# Patient Record
Sex: Male | Born: 1955
Health system: Southern US, Community
[De-identification: ages and names within clinical notes are randomized; demographics above are authoritative.]

## PROBLEM LIST (undated history)

## (undated) DIAGNOSIS — R7302 Impaired glucose tolerance (oral): Secondary | ICD-10-CM

## (undated) DIAGNOSIS — J45909 Unspecified asthma, uncomplicated: Secondary | ICD-10-CM

## (undated) DIAGNOSIS — I219 Acute myocardial infarction, unspecified: Secondary | ICD-10-CM

## (undated) DIAGNOSIS — I10 Essential (primary) hypertension: Secondary | ICD-10-CM

## (undated) HISTORY — DX: Unspecified asthma, uncomplicated: J45.909

## (undated) HISTORY — DX: Impaired glucose tolerance (oral): R73.02

---

## 1999-04-17 ENCOUNTER — Emergency Department (HOSPITAL_COMMUNITY): Admission: EM | Admit: 1999-04-17 | Discharge: 1999-04-17 | Payer: Self-pay | Admitting: Emergency Medicine

## 1999-04-18 ENCOUNTER — Encounter: Payer: Self-pay | Admitting: Emergency Medicine

## 1999-09-03 ENCOUNTER — Ambulatory Visit: Admission: RE | Admit: 1999-09-03 | Discharge: 1999-09-03 | Payer: Self-pay | Admitting: Endocrinology

## 1999-09-05 ENCOUNTER — Encounter: Payer: Self-pay | Admitting: Endocrinology

## 1999-09-05 ENCOUNTER — Ambulatory Visit (HOSPITAL_COMMUNITY): Admission: RE | Admit: 1999-09-05 | Discharge: 1999-09-05 | Payer: Self-pay | Admitting: Endocrinology

## 1999-12-03 ENCOUNTER — Ambulatory Visit (HOSPITAL_COMMUNITY): Admission: RE | Admit: 1999-12-03 | Discharge: 1999-12-03 | Payer: Self-pay

## 2000-04-28 ENCOUNTER — Ambulatory Visit: Admission: RE | Admit: 2000-04-28 | Discharge: 2000-04-28 | Payer: Self-pay | Admitting: Internal Medicine

## 2004-08-02 ENCOUNTER — Ambulatory Visit: Payer: Self-pay | Admitting: Internal Medicine

## 2004-12-28 ENCOUNTER — Ambulatory Visit: Payer: Self-pay | Admitting: Internal Medicine

## 2004-12-30 ENCOUNTER — Ambulatory Visit (HOSPITAL_COMMUNITY): Admission: RE | Admit: 2004-12-30 | Discharge: 2004-12-30 | Payer: Self-pay | Admitting: Internal Medicine

## 2005-03-09 ENCOUNTER — Ambulatory Visit: Payer: Self-pay | Admitting: Internal Medicine

## 2006-03-09 ENCOUNTER — Ambulatory Visit: Payer: Self-pay | Admitting: Internal Medicine

## 2006-08-11 ENCOUNTER — Ambulatory Visit: Payer: Self-pay | Admitting: Internal Medicine

## 2007-03-07 ENCOUNTER — Ambulatory Visit: Payer: Self-pay | Admitting: Internal Medicine

## 2007-03-07 LAB — CONVERTED CEMR LAB
ALT: 31 units/L (ref 0–53)
AST: 29 units/L (ref 0–37)
Albumin: 3.9 g/dL (ref 3.5–5.2)
Alkaline Phosphatase: 45 units/L (ref 39–117)
BUN: 10 mg/dL (ref 6–23)
Basophils Absolute: 0 10*3/uL (ref 0.0–0.1)
Basophils Relative: 0.5 % (ref 0.0–1.0)
Bilirubin Urine: NEGATIVE
Bilirubin, Direct: 0.1 mg/dL (ref 0.0–0.3)
CO2: 30 meq/L (ref 19–32)
Calcium: 9.5 mg/dL (ref 8.4–10.5)
Chloride: 109 meq/L (ref 96–112)
Cholesterol: 227 mg/dL (ref 0–200)
Creatinine, Ser: 0.9 mg/dL (ref 0.4–1.5)
Direct LDL: 152.4 mg/dL
Eosinophils Absolute: 0.2 10*3/uL (ref 0.0–0.6)
Eosinophils Relative: 3.1 % (ref 0.0–5.0)
GFR calc Af Amer: 114 mL/min
GFR calc non Af Amer: 94 mL/min
Glucose, Bld: 114 mg/dL — ABNORMAL HIGH (ref 70–99)
HCT: 47.8 % (ref 39.0–52.0)
HDL: 73.5 mg/dL (ref >39.0)
Hemoglobin: 16.4 g/dL (ref 13.0–17.0)
Ketones, ur: NEGATIVE mg/dL
Leukocytes, UA: NEGATIVE
Lymphocytes Relative: 30.4 % (ref 12.0–46.0)
MCHC: 34.4 g/dL (ref 30.0–36.0)
MCV: 93.7 fL (ref 78.0–100.0)
Monocytes Absolute: 0.8 10*3/uL — ABNORMAL HIGH (ref 0.2–0.7)
Monocytes Relative: 10.4 % (ref 3.0–11.0)
Neutro Abs: 4.3 10*3/uL (ref 1.4–7.7)
Neutrophils Relative %: 55.6 % (ref 43.0–77.0)
Nitrite: NEGATIVE
PSA: 0.5 ng/mL (ref 0.10–4.00)
Platelets: 258 10*3/uL (ref 150–400)
Potassium: 4.7 meq/L (ref 3.5–5.1)
RBC: 5.09 M/uL (ref 4.22–5.81)
RDW: 12.5 % (ref 11.5–14.6)
Sodium: 142 meq/L (ref 135–145)
Specific Gravity, Urine: 1.02 (ref 1.000–1.03)
TSH: 1.21 microintl units/mL (ref 0.35–5.50)
Total Bilirubin: 1 mg/dL (ref 0.3–1.2)
Total CHOL/HDL Ratio: 3.1
Total Protein: 7.1 g/dL (ref 6.0–8.3)
Triglycerides: 71 mg/dL (ref 0–149)
Urine Glucose: NEGATIVE mg/dL
Urobilinogen, UA: 0.2 (ref 0.0–1.0)
VLDL: 14 mg/dL (ref 0–40)
WBC: 7.6 10*3/uL (ref 4.5–10.5)
pH: 6 (ref 5.0–8.0)

## 2008-03-04 ENCOUNTER — Ambulatory Visit: Payer: Self-pay | Admitting: Internal Medicine

## 2008-03-04 DIAGNOSIS — I10 Essential (primary) hypertension: Secondary | ICD-10-CM | POA: Insufficient documentation

## 2008-03-06 DIAGNOSIS — K5730 Diverticulosis of large intestine without perforation or abscess without bleeding: Secondary | ICD-10-CM | POA: Insufficient documentation

## 2008-03-06 DIAGNOSIS — J449 Chronic obstructive pulmonary disease, unspecified: Secondary | ICD-10-CM | POA: Insufficient documentation

## 2008-03-06 DIAGNOSIS — J309 Allergic rhinitis, unspecified: Secondary | ICD-10-CM | POA: Insufficient documentation

## 2008-03-06 DIAGNOSIS — F411 Generalized anxiety disorder: Secondary | ICD-10-CM | POA: Insufficient documentation

## 2008-03-06 DIAGNOSIS — Z860100 Personal history of colon polyps, unspecified: Secondary | ICD-10-CM | POA: Insufficient documentation

## 2008-03-06 DIAGNOSIS — E785 Hyperlipidemia, unspecified: Secondary | ICD-10-CM | POA: Insufficient documentation

## 2008-03-27 ENCOUNTER — Ambulatory Visit: Payer: Self-pay | Admitting: Internal Medicine

## 2008-03-28 ENCOUNTER — Telehealth (INDEPENDENT_AMBULATORY_CARE_PROVIDER_SITE_OTHER): Payer: Self-pay | Admitting: *Deleted

## 2008-04-03 ENCOUNTER — Encounter: Payer: Self-pay | Admitting: Internal Medicine

## 2009-06-02 ENCOUNTER — Ambulatory Visit: Payer: Self-pay | Admitting: Internal Medicine

## 2009-06-02 DIAGNOSIS — R0602 Shortness of breath: Secondary | ICD-10-CM | POA: Insufficient documentation

## 2009-06-02 LAB — CONVERTED CEMR LAB
ALT: 27 units/L (ref 0–53)
AST: 28 units/L (ref 0–37)
Albumin: 4.2 g/dL (ref 3.5–5.2)
Alkaline Phosphatase: 44 units/L (ref 39–117)
BUN: 10 mg/dL (ref 6–23)
Basophils Absolute: 0.1 10*3/uL (ref 0.0–0.1)
Basophils Relative: 1.1 % (ref 0.0–3.0)
Bilirubin Urine: NEGATIVE
Bilirubin, Direct: 0.1 mg/dL (ref 0.0–0.3)
CO2: 29 meq/L (ref 19–32)
Calcium: 9.7 mg/dL (ref 8.4–10.5)
Chloride: 107 meq/L (ref 96–112)
Cholesterol: 250 mg/dL — ABNORMAL HIGH (ref 0–200)
Creatinine, Ser: 0.9 mg/dL (ref 0.4–1.5)
Direct LDL: 169.5 mg/dL
Eosinophils Absolute: 0.2 10*3/uL (ref 0.0–0.7)
Eosinophils Relative: 2.4 % (ref 0.0–5.0)
GFR calc non Af Amer: 93.36 mL/min (ref >60)
Glucose, Bld: 111 mg/dL — ABNORMAL HIGH (ref 70–99)
HCT: 47.5 % (ref 39.0–52.0)
HDL: 70.3 mg/dL (ref >39.00)
Hemoglobin: 16.5 g/dL (ref 13.0–17.0)
Ketones, ur: NEGATIVE mg/dL
Leukocytes, UA: NEGATIVE
Lymphocytes Relative: 27.9 % (ref 12.0–46.0)
Lymphs Abs: 2 10*3/uL (ref 0.7–4.0)
MCHC: 34.8 g/dL (ref 30.0–36.0)
MCV: 96.3 fL (ref 78.0–100.0)
Monocytes Absolute: 1 10*3/uL (ref 0.1–1.0)
Monocytes Relative: 13.8 % — ABNORMAL HIGH (ref 3.0–12.0)
Neutro Abs: 4 10*3/uL (ref 1.4–7.7)
Neutrophils Relative %: 54.8 % (ref 43.0–77.0)
Nitrite: NEGATIVE
PSA: 0.7 ng/mL (ref 0.10–4.00)
Platelets: 230 10*3/uL (ref 150.0–400.0)
Potassium: 4.9 meq/L (ref 3.5–5.1)
RBC: 4.93 M/uL (ref 4.22–5.81)
RDW: 12.2 % (ref 11.5–14.6)
Sodium: 142 meq/L (ref 135–145)
Specific Gravity, Urine: 1.015 (ref 1.000–1.030)
TSH: 0.96 microintl units/mL (ref 0.35–5.50)
Total Bilirubin: 1.2 mg/dL (ref 0.3–1.2)
Total CHOL/HDL Ratio: 4
Total Protein, Urine: NEGATIVE mg/dL
Total Protein: 7.6 g/dL (ref 6.0–8.3)
Triglycerides: 109 mg/dL (ref 0.0–149.0)
Urine Glucose: NEGATIVE mg/dL
Urobilinogen, UA: 0.2 (ref 0.0–1.0)
VLDL: 21.8 mg/dL (ref 0.0–40.0)
WBC: 7.3 10*3/uL (ref 4.5–10.5)
pH: 5.5 (ref 5.0–8.0)

## 2009-06-11 ENCOUNTER — Ambulatory Visit: Payer: Self-pay | Admitting: Cardiology

## 2009-06-11 ENCOUNTER — Ambulatory Visit: Payer: Self-pay

## 2009-06-11 ENCOUNTER — Encounter: Payer: Self-pay | Admitting: Internal Medicine

## 2009-06-11 ENCOUNTER — Ambulatory Visit (HOSPITAL_COMMUNITY): Admission: RE | Admit: 2009-06-11 | Discharge: 2009-06-11 | Payer: Self-pay | Admitting: Cardiology

## 2010-07-08 ENCOUNTER — Ambulatory Visit: Payer: Self-pay | Admitting: Internal Medicine

## 2010-07-08 DIAGNOSIS — I519 Heart disease, unspecified: Secondary | ICD-10-CM | POA: Insufficient documentation

## 2010-09-10 ENCOUNTER — Ambulatory Visit
Admission: RE | Admit: 2010-09-10 | Discharge: 2010-09-10 | Payer: Self-pay | Source: Home / Self Care | Attending: Internal Medicine | Admitting: Internal Medicine

## 2010-09-13 ENCOUNTER — Encounter: Payer: Self-pay | Admitting: Internal Medicine

## 2010-10-05 NOTE — Assessment & Plan Note (Signed)
Summary: follow up to refill rx-lb   Vital Signs:  Patient profile:   55 year old male Height:      73.5 inches Weight:      224.38 pounds BMI:     29.31 O2 Sat:      96 % on Room air Temp:     97.6 degrees F oral Pulse rate:   75 / minute BP sitting:   152 / 100  (left arm) Cuff size:   large  Vitals Entered By: Zella Ball Ewing CMA (AAMA) (July 08, 2010 9:05 AM)  O2 Flow:  Room air  Preventive Care Screening  Last Tetanus Booster:    Date:  09/05/2001    Results:  Tdap      decliens flu shot todoay or pneumonia shot  CC: followup refill/RE   CC:  followup refill/RE.  History of Present Illness: here for wellness, now down to   1 pack per wk, unfort gained 9 lbs since last yr;  BP at home < 140/90 ;  not owrked for 8 mo;  fortunatlye doing ok financially;  incidently with 3 wks AM prod ocugh after recent bronchitis he caught from the family;  Pt denies CP, worsening sob, doe, wheezing, orthopnea, pnd, worsening LE edema, palps, dizziness or syncope  Pt denies new neuro symptoms such as headache, facial or extremity weakness   Pt denies polydipsia, polyuria..  Overall good compliance with meds, trying to follow low chol diet, wt stable, little excercise however No fever, wt loss, night sweats, loss of appetite or other constitutional symptoms  Denies worsening depressive symptoms, suicidal ideation, or panic.   Pt states good ability with ADL's, low fall risk, home safety reviewed and adequate, no significant change in hearing or vision, trying to follow lower chol diet, and occasionally active only with regular excercise.   Preventive Screening-Counseling & Management      Drug Use:  no.    Problems Prior to Update: 1)  Diastolic Dysfunction  (ICD-429.9) 2)  Dyspnea  (ICD-786.05) 3)  Preventive Health Care  (ICD-V70.0) 4)  Diverticulosis, Colon  (ICD-562.10) 5)  Colonic Polyps, Hx of  (ICD-V12.72) 6)  COPD  (ICD-496) 7)  Anxiety  (ICD-300.00) 8)  Allergic Rhinitis   (ICD-477.9) 9)  Hyperlipidemia  (ICD-272.4) 10)  Hypertension  (ICD-401.9)  Medications Prior to Update: 1)  Epipen 2-Pak 0.3 Mg/0.45ml (1:1000)  Devi (Epinephrine Hcl (Anaphylaxis)) .... Use Asd 2)  Proair Hfa 108 (90 Base) Mcg/act Aers (Albuterol Sulfate) .... 2 Puffs Qid As Needed 3)  Norvasc 5 Mg Tabs (Amlodipine Besylate) .... Take 1 By Mouth Once Daily  Current Medications (verified): 1)  Epipen 2-Pak 0.3 Mg/0.87ml (1:1000)  Devi (Epinephrine Hcl (Anaphylaxis)) .... Use Asd 2)  Proair Hfa 108 (90 Base) Mcg/act Aers (Albuterol Sulfate) .... 2 Puffs Qid As Needed 3)  Norvasc 5 Mg Tabs (Amlodipine Besylate) .... Take 1 By Mouth Once Daily 4)  Albuterol Aerosol .... 2 Puffs Four Times Per Day As Needed  Allergies (verified): 1)  ! Doxycycline 2)  ! Ace Inhibitors 3)  ! Beta Blockers 4)  ! * Bee Stings  Past History:  Past Surgical History: Last updated: 03/04/2008 Denies surgical history  Family History: Last updated: 03/04/2008 DM heart disease thyroid disease  Social History: Last updated: 07/08/2010 Alcohol use-yes Current Smoker work - Holiday representative Drug use-no Divorced  Risk Factors: Smoking Status: current (03/04/2008)  Past Medical History: Hypertension Hyperlipidemia alcohol abuse- ongoing Allergic rhinitis Anxiety COPD hypertensive cardiomyopathy - EF 50%,  mild AI by echo 8/01 Colonic polyps, hx of Diverticulosis, colon diastolic dysfunction  Social History: Alcohol use-yes 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  The patient denies anorexia, fever, weight loss, vision loss, decreased hearing, hoarseness, chest pain, syncope, dyspnea on exertion, peripheral edema, prolonged cough, headaches, hemoptysis, abdominal pain, melena, hematochezia, severe indigestion/heartburn, hematuria, muscle weakness, suspicious skin lesions, transient blindness,  depression, unusual weight change, abnormal bleeding, enlarged lymph nodes, and angioedema.         all otherwise negative per pt -    Physical Exam  General:  alert and overweight-appearing.   Head:  normocephalic and atraumatic.   Eyes:  vision grossly intact, pupils equal, and pupils round.   Ears:  R ear normal and L ear normal.   Nose:  no external deformity and no nasal discharge.   Mouth:  no gingival abnormalities and pharynx pink and moist.   Neck:  supple and no masses.   Lungs:  normal respiratory effort and normal breath sounds.   Heart:  normal rate and regular rhythm.   Abdomen:  soft, non-tender, and normal bowel sounds.   Msk:  normal ROM, no joint tenderness, and no joint swelling.   Extremities:  no edema, no erythema  Neurologic:  cranial nerves II-XII intact and strength normal in all extremities.   Skin:  color normal and no rashes.   Psych:  normally interactive, not depressed appearing, and moderately anxious.     Impression & Recommendations:  Problem # 1:  Preventive Health Care (ICD-V70.0) Overall doing well, age appropriate education and counseling updated, referral for preventive services and immunizations addressed, dietary counseling and smoking status adressed , most recent labs reviewed I have personally reviewed and have noted 1.The patient's medical and social history 2.Their use of alcohol, tobacco or illicit drugs 3.Their current medications and supplements 4. Functional ability including ADL's, fall risk, home safety risk, hearing & visual impairment  5.Diet and physical activities 6.Evidence for depression or mood disorders The patients weight, height, BMI  have been recorded in the chart I have made referrals, counseling and provided education to the patient based review of the above   Problem # 2:  COPD (ICD-496)  decliens spiriva ; to check PFT's His updated medication list for this problem includes:    Proair Hfa 108 (90 Base) Mcg/act  Aers (Albuterol sulfate) .Marland Kitchen... 2 puffs qid as needed  Orders: Misc. Referral (Misc. Ref)  Problem # 3:  HYPERLIPIDEMIA (ICD-272.4)  declines statin, Pt to continue diet efforts, good med tolerance; to check labs - goal LDL less than 100  Labs Reviewed: SGOT: 28 (06/02/2009)   SGPT: 27 (06/02/2009)   HDL:70.30 (06/02/2009), 73.5 (03/07/2007)  LDL:DEL (03/07/2007)  Chol:250 (06/02/2009), 227 (03/07/2007)  Trig:109.0 (06/02/2009), 71 (03/07/2007)  Problem # 4:  HYPERTENSION (ICD-401.9)  His updated medication list for this problem includes:    Norvasc 5 Mg Tabs (Amlodipine besylate) .Marland Kitchen... Take 1 by mouth once daily stable overall by hx and exam, ok to continue meds/tx as is   BP today: 152/100 Prior BP: 154/90 (06/02/2009)  Labs Reviewed: K+: 4.9 (06/02/2009) Creat: : 0.9 (06/02/2009)   Chol: 250 (06/02/2009)   HDL: 70.30 (06/02/2009)   LDL: DEL (03/07/2007)   TG: 109.0 (06/02/2009)  Complete Medication List: 1)  Epipen 2-pak 0.3 Mg/0.26ml (1:1000) Devi (Epinephrine hcl (anaphylaxis)) .... Use asd 2)  Proair Hfa  108 (90 Base) Mcg/act Aers (Albuterol sulfate) .... 2 puffs qid as needed 3)  Norvasc 5 Mg Tabs (Amlodipine besylate) .... Take 1 by mouth once daily 4)  Albuterol Aerosol  .... 2 puffs four times per day as needed  Patient Instructions: 1)  Continue all previous medications as before this visit 2)  Please call if you would like your blood work done later 3)  You will be contacted about the referral(s) to: Lung testing after the first of the year 4)  Please schedule a follow-up appointment in 1 year, or sooner if needed Prescriptions: EPIPEN 2-PAK 0.3 MG/0.3ML (1:1000)  DEVI (EPINEPHRINE HCL (ANAPHYLAXIS)) use asd  #1 x 0   Entered and Authorized by:   Corwin Levins MD   Signed by:   Corwin Levins MD on 07/08/2010   Method used:   Print then Give to Patient   RxID:   [id number] NORVASC 5 MG TABS (AMLODIPINE BESYLATE) take 1 by mouth once daily Brand medically  necessary #30 x 11   Entered and Authorized by:   Corwin Levins MD   Signed by:   Corwin Levins MD on 07/08/2010   Method used:   Print then Give to Patient   RxID:   [id number] ALBUTEROL AEROSOL 2 puffs four times per day as needed  #1 x 11   Entered and Authorized by:   Corwin Levins MD   Signed by:   Corwin Levins MD on 07/08/2010   Method used:   Print then Give to Patient   RxID:   [id number]    Orders Added: 1)  Misc. Referral [Misc. Ref] 2)  Est. Patient 40-64 years [phone]

## 2010-10-07 NOTE — Miscellaneous (Signed)
Summary: Orders Update pft charges  Clinical Lists Changes  Orders: Added new Service order of Carbon Monoxide diffusing w/capacity (94720) - Signed Added new Service order of Lung Volumes (94240) - Signed Added new Service order of Spirometry (Pre & Post) (94060) - Signed 

## 2011-07-08 ENCOUNTER — Other Ambulatory Visit: Payer: Self-pay | Admitting: Internal Medicine

## 2011-09-28 ENCOUNTER — Other Ambulatory Visit: Payer: Self-pay | Admitting: Internal Medicine

## 2011-11-14 ENCOUNTER — Other Ambulatory Visit: Payer: Self-pay | Admitting: Internal Medicine

## 2012-02-04 ENCOUNTER — Emergency Department (HOSPITAL_COMMUNITY)
Admission: EM | Admit: 2012-02-04 | Discharge: 2012-02-05 | Discharge status: Against Medical Advice | Payer: BC Managed Care – PPO | Attending: Emergency Medicine | Admitting: Emergency Medicine

## 2012-02-04 ENCOUNTER — Encounter (HOSPITAL_COMMUNITY): Payer: Self-pay | Admitting: Emergency Medicine

## 2012-02-04 DIAGNOSIS — I10 Essential (primary) hypertension: Secondary | ICD-10-CM

## 2012-02-04 DIAGNOSIS — J449 Chronic obstructive pulmonary disease, unspecified: Secondary | ICD-10-CM | POA: Insufficient documentation

## 2012-02-04 DIAGNOSIS — R079 Chest pain, unspecified: Secondary | ICD-10-CM

## 2012-02-04 DIAGNOSIS — J4489 Other specified chronic obstructive pulmonary disease: Secondary | ICD-10-CM | POA: Insufficient documentation

## 2012-02-04 DIAGNOSIS — F17200 Nicotine dependence, unspecified, uncomplicated: Secondary | ICD-10-CM | POA: Insufficient documentation

## 2012-02-04 HISTORY — DX: Essential (primary) hypertension: I10

## 2012-02-04 HISTORY — DX: Acute myocardial infarction, unspecified: I21.9

## 2012-02-04 NOTE — ED Notes (Signed)
PT. ARRIVED WITH EMS FROM HOME , REPORTS MID CHEST PAIN WHILE EATING DINNER THIS EVENING AND VOMITTED ONCE , DENIES SOB , RECEIVED 4 BABY ASA BY EMS PTA , STATES " MILD HEART ATTACK WHEN I WAS 56 YEARS OLD.

## 2012-02-05 ENCOUNTER — Emergency Department (HOSPITAL_COMMUNITY): Payer: BC Managed Care – PPO

## 2012-02-05 LAB — BASIC METABOLIC PANEL
BUN: 10 mg/dL (ref 6–23)
Calcium: 8.6 mg/dL (ref 8.4–10.5)
GFR calc non Af Amer: 78 mL/min — ABNORMAL LOW (ref >90)
Glucose, Bld: 117 mg/dL — ABNORMAL HIGH (ref 70–99)

## 2012-02-05 LAB — CBC
HCT: 42.2 % (ref 39.0–52.0)
Hemoglobin: 15 g/dL (ref 13.0–17.0)
MCH: 32.4 pg (ref 26.0–34.0)
MCHC: 35.5 g/dL (ref 30.0–36.0)
MCV: 91.1 fL (ref 78.0–100.0)
Platelets: 195 10*3/uL (ref 150–400)
RBC: 4.63 MIL/uL (ref 4.22–5.81)
RDW: 12.6 % (ref 11.5–15.5)
WBC: 8.3 10*3/uL (ref 4.0–10.5)

## 2012-02-05 LAB — DIFFERENTIAL
Eosinophils Absolute: 0.2 10*3/uL (ref 0.0–0.7)
Eosinophils Relative: 3 % (ref 0–5)
Lymphs Abs: 2.4 10*3/uL (ref 0.7–4.0)
Monocytes Relative: 9 % (ref 3–12)

## 2012-02-05 NOTE — ED Provider Notes (Signed)
History     CSN: [id number]  Arrival date & time 02/04/12  2342   First MD Initiated Contact with Patient 02/04/12 2343      Chief Complaint  Patient presents with  . Chest Pain    (Consider location/radiation/quality/duration/timing/severity/associated sxs/prior treatment) HPI Comments: Patient that is a current everyday smoker with a history of heart attack (age 56), hypertension, and COPD presents emergency department with chief complaint of chest pain.  Onset of symptoms began this evening around 10 p.m. while eating dinner.  Location substernal without radiation.  Description is "I feel like an ice pick is stabbing my sternum" patient states that he feels if she could burp or have an emesis episode it would get better.  The pain is constant and still present.  Severity was 7/10 and is now currently 3/10 after taking 324 ASA.  Patient refused nitroglycerin in route because it causes headaches and he states he does not want any narcotics throughout his stay because he has had substance abuse issues in the past before. Associated symptoms include vomiting times one and nausea that did not improve the chest pain.  Patient denies any diaphoresis, shortness of breath, exertional chest pain, abdominal pain, back pain, jaw or left arm pain, PND, orthopnea, claudication, extremity edema, fevers, night sweats, chills, cough.  No other complaints this time.  PCP Dr. Jonny Ruiz Last 2D echo in 2010: Study Conclusions 1. Left ventricle: There is diastolic dysfunction. The estimated ejection fraction was 60%. Wall motion was normal; there were no regional wall motion abnormalities. 2. Right ventricle: Systolic function was normal.   Patient is a 56 y.o. male presenting with chest pain. The history is provided by the patient.  Chest Pain     Past Medical History  Diagnosis Date  . Asthma   . Hypertension   . Heart attack     History reviewed. No pertinent past surgical history.  No family history  on file.  History  Substance Use Topics  . Smoking status: Current Everyday Smoker  . Smokeless tobacco: Not on file  . Alcohol Use: Yes      Review of Systems  Cardiovascular: Positive for chest pain.  All other systems reviewed and are negative.    Allergies  Ace inhibitors; Beta adrenergic blockers; and Doxycycline  Home Medications   Current Outpatient Rx  Name Route Sig Dispense Refill  . AMLODIPINE BESYLATE 5 MG PO TABS Oral Take 5 mg by mouth daily.    . ASPIRIN 325 MG PO TABS Oral Take 325 mg by mouth 2 (two) times a week.      BP 148/94  Pulse 70  Temp(Src) 98.4 F (36.9 C) (Oral)  Resp 16  SpO2 97%  Physical Exam  Nursing note and vitals reviewed. Constitutional: He appears well-developed and well-nourished. No distress.  HENT:  Head: Normocephalic and atraumatic.  Eyes: Conjunctivae and EOM are normal. Pupils are equal, round, and reactive to light.  Neck: Normal range of motion. Neck supple. Normal carotid pulses and no JVD present. Carotid bruit is not present. No rigidity. Normal range of motion present.  Cardiovascular: Normal rate, regular rhythm, S1 normal, S2 normal, normal heart sounds, intact distal pulses and normal pulses.  Exam reveals no gallop and no friction rub.   No murmur heard.      No pitting edema bilaterally, RRR, no aberrant sounds on auscultations, distal pulses intact, no carotid bruit or JVD.   Pulmonary/Chest: Effort normal and breath sounds normal. No accessory muscle usage  or stridor. No respiratory distress. He exhibits no tenderness and no bony tenderness.  Abdominal: Bowel sounds are normal.       Obese non tender. Non pulsatile aorta.   Skin: Skin is warm, dry and intact. No rash noted. He is not diaphoretic. No cyanosis. Nails show no clubbing.    ED Course  Procedures (including critical care time)  Labs Reviewed  BASIC METABOLIC PANEL - Abnormal; Notable for the following:    Glucose, Bld 117 (*)    GFR calc non  Af Amer 78 (*)    GFR calc Af Amer 90 (*)    All other components within normal limits  CBC  DIFFERENTIAL  POCT I-STAT TROPONIN I   Dg Chest 2 View  02/05/2012  *RADIOLOGY REPORT*  Clinical Data: Chest pain.  Smoker.  COPD.  CHEST - 2 VIEW  Comparison: 06/02/2009.  Findings: The heart remains normal in size and the aorta remains tortuous.  Bullous changes are again demonstrated.  There is also a small amount of linear atelectasis or scarring in the right middle lobe without significant change.  Otherwise, clear lungs. Unremarkable bones.  IMPRESSION: Stable changes of COPD.  No acute abnormality.  Original Report Authenticated By: Darrol Angel, M.D.     No diagnosis found.   Date: 02/05/2012  Rate: 75  Rhythm: normal sinus rhythm  QRS Axis: normal  Intervals: normal  ST/T Wave abnormalities: normal  Conduction Disutrbances: none  Narrative Interpretation:   Old EKG Reviewed: No significant changes noted      MDM  Atypical Chest pain   Do to cardiac risk factors I recommended patient be admitted to hospital for further evaluation of chest pain.  Patient refuses and wants to leave AGAINST MEDICAL ADVICE.  Discussed thoroughly reasons to return to the emergency department and explained that he is always welcome to come back.  Patient verbalizes understanding of the risk of leaving and states that he still wants to leave.  Patient is still currently having active chest pain at 3/10 but states he will not sit the hospital and will followup with his doctor.  Patient with vital signs stable, normal chest x-ray, labs within normal limits and non-elevated troponin prior to leaving.  Patient was discussed with Dr. Hyacinth Meeker who agreed with my plan to admit the patient and has been notified on patient's choice to leave the hospital AMA.      Jaci Carrel, [place] 02/05/12 0126

## 2012-02-05 NOTE — ED Provider Notes (Signed)
Medical screening examination/treatment/procedure(s) were performed by non-physician practitioner and as supervising physician I was immediately available for consultation/collaboration.    Vida Roller, MD 02/05/12 [phone]

## 2012-02-05 NOTE — ED Notes (Signed)
PT. SIGNED AMA FORM , DENIES PAIN , NO SOB.

## 2012-11-05 ENCOUNTER — Other Ambulatory Visit: Payer: Self-pay | Admitting: Internal Medicine

## 2012-11-05 NOTE — Telephone Encounter (Signed)
Done erx , 1 mo only  Please contact pt - due for ROV

## 2012-11-05 NOTE — Telephone Encounter (Signed)
Do not see where pt has been seen at this office. Please advise.

## 2012-11-07 ENCOUNTER — Other Ambulatory Visit: Payer: Self-pay | Admitting: Internal Medicine

## 2012-12-05 ENCOUNTER — Other Ambulatory Visit: Payer: Self-pay | Admitting: Internal Medicine

## 2012-12-07 ENCOUNTER — Other Ambulatory Visit: Payer: Self-pay | Admitting: Internal Medicine

## 2012-12-12 ENCOUNTER — Other Ambulatory Visit: Payer: Self-pay | Admitting: Internal Medicine

## 2012-12-12 ENCOUNTER — Encounter: Payer: Self-pay | Admitting: Internal Medicine

## 2012-12-12 ENCOUNTER — Ambulatory Visit (INDEPENDENT_AMBULATORY_CARE_PROVIDER_SITE_OTHER): Payer: BC Managed Care – PPO | Admitting: Internal Medicine

## 2012-12-12 ENCOUNTER — Other Ambulatory Visit (INDEPENDENT_AMBULATORY_CARE_PROVIDER_SITE_OTHER): Payer: BC Managed Care – PPO

## 2012-12-12 VITALS — BP 132/78 | HR 73 | Temp 97.6°F | Ht 73.0 in | Wt 225.0 lb

## 2012-12-12 DIAGNOSIS — J449 Chronic obstructive pulmonary disease, unspecified: Secondary | ICD-10-CM

## 2012-12-12 DIAGNOSIS — Z0001 Encounter for general adult medical examination with abnormal findings: Secondary | ICD-10-CM | POA: Insufficient documentation

## 2012-12-12 DIAGNOSIS — R7309 Other abnormal glucose: Secondary | ICD-10-CM

## 2012-12-12 DIAGNOSIS — R7302 Impaired glucose tolerance (oral): Secondary | ICD-10-CM

## 2012-12-12 DIAGNOSIS — J4489 Other specified chronic obstructive pulmonary disease: Secondary | ICD-10-CM

## 2012-12-12 DIAGNOSIS — I10 Essential (primary) hypertension: Secondary | ICD-10-CM

## 2012-12-12 DIAGNOSIS — Z87891 Personal history of nicotine dependence: Secondary | ICD-10-CM | POA: Insufficient documentation

## 2012-12-12 DIAGNOSIS — F411 Generalized anxiety disorder: Secondary | ICD-10-CM

## 2012-12-12 DIAGNOSIS — Z0000 Encounter for general adult medical examination without abnormal findings: Secondary | ICD-10-CM

## 2012-12-12 DIAGNOSIS — F1010 Alcohol abuse, uncomplicated: Secondary | ICD-10-CM

## 2012-12-12 DIAGNOSIS — F17200 Nicotine dependence, unspecified, uncomplicated: Secondary | ICD-10-CM

## 2012-12-12 HISTORY — DX: Impaired glucose tolerance (oral): R73.02

## 2012-12-12 LAB — CBC WITH DIFFERENTIAL/PLATELET
Basophils Relative: 0.5 % (ref 0.0–3.0)
Eosinophils Relative: 2.1 % (ref 0.0–5.0)
HCT: 46.3 % (ref 39.0–52.0)
Hemoglobin: 16.2 g/dL (ref 13.0–17.0)
Lymphs Abs: 2.3 10*3/uL (ref 0.7–4.0)
MCV: 95.2 fl (ref 78.0–100.0)
Monocytes Absolute: 1 10*3/uL (ref 0.1–1.0)
Neutro Abs: 4.8 10*3/uL (ref 1.4–7.7)
Platelets: 261 10*3/uL (ref 150.0–400.0)
WBC: 8.2 10*3/uL (ref 4.5–10.5)

## 2012-12-12 LAB — URINALYSIS, ROUTINE W REFLEX MICROSCOPIC
Bilirubin Urine: NEGATIVE
Ketones, ur: NEGATIVE
Specific Gravity, Urine: 1.01 (ref 1.000–1.030)
Total Protein, Urine: NEGATIVE
Urine Glucose: NEGATIVE
pH: 6 (ref 5.0–8.0)

## 2012-12-12 LAB — LDL CHOLESTEROL, DIRECT: Direct LDL: 150.6 mg/dL

## 2012-12-12 LAB — HEPATIC FUNCTION PANEL
AST: 28 U/L (ref 0–37)
Total Bilirubin: 1 mg/dL (ref 0.3–1.2)

## 2012-12-12 LAB — LIPID PANEL
HDL: 63.7 mg/dL (ref >39.00)
Triglycerides: 91 mg/dL (ref 0.0–149.0)
VLDL: 18.2 mg/dL (ref 0.0–40.0)

## 2012-12-12 LAB — PSA: PSA: 0.77 ng/mL (ref 0.10–4.00)

## 2012-12-12 LAB — BASIC METABOLIC PANEL
BUN: 10 mg/dL (ref 6–23)
Calcium: 9.2 mg/dL (ref 8.4–10.5)
GFR: 102.62 mL/min (ref >60.00)
Potassium: 4.5 mEq/L (ref 3.5–5.1)
Sodium: 137 mEq/L (ref 135–145)

## 2012-12-12 LAB — HEMOGLOBIN A1C: Hgb A1c MFr Bld: 5.5 % (ref 4.6–6.5)

## 2012-12-12 LAB — TSH: TSH: 1.08 u[IU]/mL (ref 0.35–5.50)

## 2012-12-12 MED ORDER — TIOTROPIUM BROMIDE MONOHYDRATE 18 MCG IN CAPS: 18.0000 ug | ORAL_CAPSULE | Freq: Every day | RESPIRATORY_TRACT | Status: DC

## 2012-12-12 MED ORDER — ALBUTEROL SULFATE HFA 108 (90 BASE) MCG/ACT IN AERS: 2.0000 | INHALATION_SPRAY | Freq: Four times a day (QID) | RESPIRATORY_TRACT | Status: DC | PRN

## 2012-12-12 MED ORDER — EPINEPHRINE 0.3 MG/0.3ML IJ DEVI: 0.3000 mg | Freq: Once | INTRAMUSCULAR | Status: DC

## 2012-12-12 MED ORDER — ATORVASTATIN CALCIUM 10 MG PO TABS: 10.0000 mg | ORAL_TABLET | Freq: Every day | ORAL | Status: DC

## 2012-12-12 MED ORDER — FLUTICASONE-SALMETEROL 250-50 MCG/DOSE IN AEPB: 1.0000 | INHALATION_SPRAY | Freq: Two times a day (BID) | RESPIRATORY_TRACT | Status: DC

## 2012-12-12 MED ORDER — AMLODIPINE BESYLATE 5 MG PO TABS: 5.0000 mg | ORAL_TABLET | Freq: Every day | ORAL | Status: DC

## 2012-12-12 MED ORDER — ASPIRIN 81 MG PO TBEC: 81.0000 mg | DELAYED_RELEASE_TABLET | Freq: Every day | ORAL | Status: AC

## 2012-12-12 NOTE — Assessment & Plan Note (Signed)
Asympt, for a1c 

## 2012-12-12 NOTE — Assessment & Plan Note (Signed)
To add spiriva, and advair trial as well, cont proair, declines pulm referral, cxr or other eval or tx at this time

## 2012-12-12 NOTE — Assessment & Plan Note (Signed)
Urged to abstain 

## 2012-12-12 NOTE — Assessment & Plan Note (Signed)
stable overall by history and exam, recent data reviewed with pt, and pt to continue medical treatment as before,  to f/u any worsening symptoms or concerns BP Readings from Last 3 Encounters:  12/12/12 132/78  02/05/12 148/94  07/08/10 152/100

## 2012-12-12 NOTE — Assessment & Plan Note (Signed)

## 2012-12-12 NOTE — Patient Instructions (Addendum)
Please try the Spiriva at one inhalation per day Please also try the Advair 250/50 at 1 puff twice per day You are also given the Albuterol inhaler to use as needed (and a prescription sent as well) Please call if you feel the spiriva and/or the advair helped so that we can send a prescription Please continue all other medications as before, and refills have been done if requested - the Brand Name Norvasc 5 mg, and the Epipen Please also take the Aspirin 81 mg (enteric coated) every day at 1 per day, as this reduces risk of heart attack and stroke Please stop smoking Please stop drinking Alcohol (or at least cut back) You can consider applying for Disability at the local Medicare office on [address] Please call if you change your mind about the colonoscopy Please go to the LAB in the Basement (turn left off the elevator) for the tests to be done today You will be contacted by phone if any changes need to be made immediately.  Otherwise, you will receive a letter about your results with an explanation Please remember to sign up for My Chart if you have not done so, as this will be important to you in the future with finding out test results, communicating by private email, and scheduling acute appointments online when needed. Please return in 1 year for your yearly visit, or sooner if needed, with Lab testing done 3-5 days before

## 2012-12-12 NOTE — Assessment & Plan Note (Signed)
Counseled to quit, declines chantix 

## 2012-12-12 NOTE — Progress Notes (Signed)
Subjective:    Patient ID: Colin Wilson, male    DOB: Mar 13, 1956, 57 y.o.   MRN: [id number]  HPI  Here for wellness and f/u after lost to f/u since early 2012;  Overall doing ok;  Pt denies CP,  wheezing, orthopnea, PND, worsening LE edema, palpitations, dizziness or syncope, but has had some worsening sob/doe since last seen, using proair more than 2 times per wk, worse seasonally it seems.   Pt denies neurological change such as new headache, facial or extremity weakness.  Pt denies polydipsia, polyuria, or low sugar symptoms. Pt states overall good compliance with treatment and medications, good tolerability, and has been trying to follow lower cholesterol diet.  Pt denies worsening depressive symptoms, suicidal ideation or panic. No fever, night sweats, wt loss, loss of appetite, or other constitutional symptoms.  Pt states good ability with ADL's, has low fall risk, home safety reviewed and adequate, no other significant changes in hearing or vision, and only occasionally active with exercise.  Need med refills.  Has no complaints and quite adamant he only wants his med refill so he can leave asap. Admits to signficant ETOH use but declines to be specific, and declines colonoscopy.  Still smoking, not ready to quit Past Medical History  Diagnosis Date  . Asthma   . Hypertension   . Heart attack    No past surgical history on file.  reports that he has been smoking.  He does not have any smokeless tobacco history on file. He reports that  drinks alcohol. He reports that he does not use illicit drugs. family history is not on file. Allergies  Allergen Reactions  . Ace Inhibitors Other (See Comments)    unknown  . Beta Adrenergic Blockers Other (See Comments)    unknown  . Doxycycline Other (See Comments)    unknown   No current outpatient prescriptions on file prior to visit.   No current facility-administered medications on file prior to visit.   Review of Systems Constitutional:  Negative for diaphoresis, activity change, appetite change or unexpected weight change.  HENT: Negative for hearing loss, ear pain, facial swelling, mouth sores and neck stiffness.   Eyes: Negative for pain, redness and visual disturbance.  Respiratory: Negative for shortness of breath and wheezing.   Cardiovascular: Negative for chest pain and palpitations.  Gastrointestinal: Negative for diarrhea, blood in stool, abdominal distention or other pain Genitourinary: Negative for hematuria, flank pain or change in urine volume.  Musculoskeletal: Negative for myalgias and joint swelling.  Skin: Negative for color change and wound.  Neurological: Negative for syncope and numbness. other than noted Hematological: Negative for adenopathy.  Psychiatric/Behavioral: Negative for hallucinations, self-injury, decreased concentration and agitation.       Objective:   Physical Exam BP 132/78  Pulse 73  Temp(Src) 97.6 F (36.4 C) (Oral)  Ht 6\' 1"  (1.854 m)  Wt 225 lb (102.059 kg)  BMI 29.69 kg/m2  SpO2 96% VS noted, appears to have gained some wt Constitutional: Pt is oriented to person, place, and time. Appears well-developed and well-nourished.  Head: Normocephalic and atraumatic.  Right Ear: External ear normal.  Left Ear: External ear normal.  Nose: Nose normal.  Mouth/Throat: Oropharynx is clear and moist.  Eyes: Conjunctivae and EOM are normal. Pupils are equal, round, and reactive to light.  Neck: Normal range of motion. Neck supple. No JVD present. No tracheal deviation present.  Cardiovascular: Normal rate, regular rhythm, normal heart sounds and intact distal pulses.  Pulmonary/Chest: Effort normal and breath sounds normal.  Abdominal: Soft. Bowel sounds are normal. There is no tenderness. No HSM  Musculoskeletal: Normal range of motion. Exhibits no edema.  Lymphadenopathy:  Has no cervical adenopathy.  Neurological: Pt is alert and oriented to person, place, and time. Pt has  normal reflexes. No cranial nerve deficit. Motor/dtr intact Skin: Skin is warm and dry. No rash noted.  Psychiatric:  Some irritable and nervous today. Behavior is o/w normal.     Assessment & Plan:

## 2012-12-12 NOTE — Assessment & Plan Note (Signed)
Mild, declines further tx 

## 2013-07-26 ENCOUNTER — Encounter (HOSPITAL_COMMUNITY): Payer: Self-pay | Admitting: Emergency Medicine

## 2013-07-26 ENCOUNTER — Telehealth: Payer: Self-pay | Admitting: Internal Medicine

## 2013-07-26 ENCOUNTER — Emergency Department (HOSPITAL_COMMUNITY)
Admission: EM | Admit: 2013-07-26 | Discharge: 2013-07-26 | Disposition: A | Discharge status: Home / Self Care | Payer: BC Managed Care – PPO | Attending: Emergency Medicine | Admitting: Emergency Medicine

## 2013-07-26 ENCOUNTER — Emergency Department (HOSPITAL_COMMUNITY): Payer: BC Managed Care – PPO

## 2013-07-26 DIAGNOSIS — R0609 Other forms of dyspnea: Secondary | ICD-10-CM | POA: Insufficient documentation

## 2013-07-26 DIAGNOSIS — R609 Edema, unspecified: Secondary | ICD-10-CM | POA: Insufficient documentation

## 2013-07-26 DIAGNOSIS — Z7982 Long term (current) use of aspirin: Secondary | ICD-10-CM | POA: Insufficient documentation

## 2013-07-26 DIAGNOSIS — Z8709 Personal history of other diseases of the respiratory system: Secondary | ICD-10-CM | POA: Insufficient documentation

## 2013-07-26 DIAGNOSIS — R0989 Other specified symptoms and signs involving the circulatory and respiratory systems: Secondary | ICD-10-CM | POA: Insufficient documentation

## 2013-07-26 DIAGNOSIS — R062 Wheezing: Secondary | ICD-10-CM | POA: Insufficient documentation

## 2013-07-26 DIAGNOSIS — I252 Old myocardial infarction: Secondary | ICD-10-CM | POA: Insufficient documentation

## 2013-07-26 DIAGNOSIS — Z881 Allergy status to other antibiotic agents status: Secondary | ICD-10-CM | POA: Insufficient documentation

## 2013-07-26 DIAGNOSIS — I10 Essential (primary) hypertension: Secondary | ICD-10-CM | POA: Insufficient documentation

## 2013-07-26 DIAGNOSIS — F17200 Nicotine dependence, unspecified, uncomplicated: Secondary | ICD-10-CM | POA: Insufficient documentation

## 2013-07-26 DIAGNOSIS — Z888 Allergy status to other drugs, medicaments and biological substances status: Secondary | ICD-10-CM | POA: Insufficient documentation

## 2013-07-26 DIAGNOSIS — Z79899 Other long term (current) drug therapy: Secondary | ICD-10-CM | POA: Insufficient documentation

## 2013-07-26 DIAGNOSIS — J441 Chronic obstructive pulmonary disease with (acute) exacerbation: Secondary | ICD-10-CM | POA: Insufficient documentation

## 2013-07-26 MED ORDER — ALBUTEROL SULFATE (5 MG/ML) 0.5% IN NEBU
2.5000 mg | INHALATION_SOLUTION | Freq: Once | RESPIRATORY_TRACT | Status: AC
  Administered 2013-07-26: 2.5 mg via RESPIRATORY_TRACT
  Filled 2013-07-26: qty 0.5

## 2013-07-26 MED ORDER — PREDNISONE 20 MG PO TABS
60.0000 mg | ORAL_TABLET | Freq: Once | ORAL | Status: AC
  Administered 2013-07-26: 60 mg via ORAL
  Filled 2013-07-26: qty 3

## 2013-07-26 MED ORDER — ALBUTEROL SULFATE HFA 108 (90 BASE) MCG/ACT IN AERS: 2.0000 | INHALATION_SPRAY | Freq: Four times a day (QID) | RESPIRATORY_TRACT | Status: DC | PRN

## 2013-07-26 MED ORDER — SULFAMETHOXAZOLE-TMP DS 800-160 MG PO TABS: 1.0000 | ORAL_TABLET | Freq: Two times a day (BID) | ORAL | Status: DC

## 2013-07-26 MED ORDER — SULFAMETHOXAZOLE-TMP DS 800-160 MG PO TABS
1.0000 | ORAL_TABLET | Freq: Once | ORAL | Status: AC
  Administered 2013-07-26: 1 via ORAL
  Filled 2013-07-26: qty 1

## 2013-07-26 MED ORDER — PREDNISONE 50 MG PO TABS: 50.0000 mg | ORAL_TABLET | Freq: Every day | ORAL | Status: DC

## 2013-07-26 NOTE — ED Provider Notes (Signed)
CSN: [id number]     Arrival date & time 07/26/13  1343 History   First MD Initiated Contact with Patient 07/26/13 1457     Chief Complaint  Patient presents with  . Shortness of Breath   (Consider location/radiation/quality/duration/timing/severity/associated sxs/prior Treatment) Patient is a 57 y.o. male presenting with shortness of breath.  Shortness of Breath Associated symptoms: cough and wheezing   Associated symptoms: no abdominal pain, no diaphoresis, no fever, no headaches, no rash, no sore throat and no vomiting     57 year old male here with worsening dyspnea for the past 7 days. He states that his dyspnea has been worsening steadily for about 6 weeks, that has drastically worsened in the last 7 days. Previously used albuterol to 3 times daily, but for the last 6 weeks his began using at night more and more now using it 10-12 times daily. He also notes stable productive cough, and he is still in every day smoker of 2-3 cigarettes. Albuterol helps temporarily but never last more than an hour or so.  He notes that his symptoms are worse at night stating that he only gets around one hour of sleep at a time per night for the last few nights.    Past Medical History  Diagnosis Date  . Asthma   . Hypertension   . Heart attack   . Impaired glucose tolerance 12/12/2012   History reviewed. No pertinent past surgical history. No family history on file. History  Substance Use Topics  . Smoking status: Current Every Day Smoker  . Smokeless tobacco: Not on file  . Alcohol Use: Yes    Review of Systems  Constitutional: Negative for fever, chills and diaphoresis.  HENT: Negative for sore throat.   Eyes: Negative for visual disturbance.  Respiratory: Positive for cough, shortness of breath and wheezing.   Gastrointestinal: Negative for nausea, vomiting, abdominal pain and diarrhea.  Genitourinary: Negative for dysuria.  Musculoskeletal: Negative for back pain.  Skin: Negative for  rash.  Neurological: Negative for headaches.    Allergies  Ace inhibitors; Beta adrenergic blockers; and Doxycycline  Home Medications   Current Outpatient Rx  Name  Route  Sig  Dispense  Refill  . amLODipine (NORVASC) 5 MG tablet   Oral   Take 1 tablet (5 mg total) by mouth daily.   90 tablet   3     Dispense as written.   Marland Kitchen aspirin 81 MG EC tablet   Oral   Take 1 tablet (81 mg total) by mouth daily. Swallow whole.   30 tablet   12   . EPINEPHrine (EPIPEN) 0.3 mg/0.3 mL DEVI   Intramuscular   Inject 0.3 mLs (0.3 mg total) into the muscle once.   2 Device   1   . albuterol (PROVENTIL HFA;VENTOLIN HFA) 108 (90 BASE) MCG/ACT inhaler   Inhalation   Inhale 2 puffs into the lungs every 6 (six) hours as needed for wheezing.   1 Inhaler   0   . predniSONE (DELTASONE) 50 MG tablet   Oral   Take 1 tablet (50 mg total) by mouth daily with breakfast.   4 tablet   0   . sulfamethoxazole-trimethoprim (BACTRIM DS) 800-160 MG per tablet   Oral   Take 1 tablet by mouth 2 (two) times daily.   13 tablet   0    BP 178/114  Pulse 83  Temp(Src) 98.3 F (36.8 C)  Resp 20  SpO2 99% Physical Exam  Nursing note and vitals  reviewed. Constitutional: He is oriented to person, place, and time. He appears well-developed and well-nourished. No distress.  HENT:  Head: Normocephalic and atraumatic.  Eyes: EOM are normal. Pupils are equal, round, and reactive to light.  Neck: Normal range of motion. Neck supple.  Cardiovascular: Normal rate, regular rhythm and normal heart sounds.   Pulmonary/Chest: Effort normal. No respiratory distress. He has wheezes.  Abdominal: Soft. Bowel sounds are normal. There is no tenderness.  Musculoskeletal: He exhibits edema.  Trace to 1+ edema slightly more prominent on R.   Neurological: He is alert and oriented to person, place, and time.  Skin: Skin is warm and dry. He is not diaphoretic.  Psychiatric: He has a normal mood and affect.    ED  Course  Procedures (including critical care time) Labs Review Labs Reviewed - No data to display Imaging Review Dg Chest 2 View  07/26/2013   CLINICAL DATA:  57 year old male with shortness of breath, worsening cough. Initial encounter.  EXAM: CHEST  2 VIEW  COMPARISON:  02/05/2012 and earlier.  FINDINGS: Larger lung volumes. Tortuous thoracic aorta. Other mediastinal contours are within normal limits. Visualized tracheal air column is within normal limits. No pneumothorax or pulmonary edema. No pleural effusion or confluent pulmonary opacity. Attenuation of upper lobe vascular markings suggesting emphysema. No acute osseous abnormality identified.  IMPRESSION: No acute cardiopulmonary abnormality.  Emphysema suspected.   Electronically Signed   By: Augusto Gamble M.D.   On: 07/26/2013 14:29    EKG Interpretation   None       MDM   1. COPD exacerbation    57 year old male here with apparent COPD exacerbation. Chest x-ray rules out pneumonia and pulmonary edema. Had some improvement with nebulizer treatment here, treated with prednisone and Bactrim. His Bactrim as he is allergic to doxycycline.  Encouraged followup with his PCP is arty scheduled next week Encouraged use of Advair, and 3 that per his PCPs recommendation  Red flags given, return for worsening symptoms  Murtis Sink, MD [facility] Resident, PGY-2 07/26/2013, 5:09 PM        Elenora Gamma, MD 07/26/13 [phone]

## 2013-07-26 NOTE — Telephone Encounter (Signed)
Patient Information:  Caller Name: Mihail  Phone: [phone]  Patient: Colin Wilson, Colin Wilson  Gender: Male  DOB: 04-05-1956  Age: 57 Years  PCP: Oliver Barre (Adults only)  Office Follow Up:  Does the office need to follow up with this patient?: No  Instructions For The Office: N/A  RN Note:  Pt will call wife now and go to [facility] ED now  Symptoms  Reason For Call & Symptoms: Pt is calling and states that he is having difficulty breathing; sx started 4-6 weeks ago; breathing seems like it is getting worse; using rescue inhaler every 3 hours and getting up every 2 hours to use the inhaler;  any exertion he becomes SOB;  Reviewed Health History In EMR: Yes  Reviewed Medications In EMR: Yes  Reviewed Allergies In EMR: Yes  Reviewed Surgeries / Procedures: Yes  Date of Onset of Symptoms: Unknown  Treatments Tried: Albuterol  Treatments Tried Worked: No  Guideline(s) Used:  Breathing Difficulty  Asthma Attack  Disposition Per Guideline:   Go to ED Now  Reason For Disposition Reached:   Severe asthma attack (e.g., very SOB at rest, speaks in single words, loud wheezes)  Advice Given:  N/A  Patient Will Follow Care Advice:  YES

## 2013-07-26 NOTE — ED Notes (Signed)
Copd getting worse over the last month cannot breath well must sleep on 2 pillows

## 2013-07-26 NOTE — ED Notes (Signed)
MD at bedside. 

## 2013-07-30 ENCOUNTER — Ambulatory Visit: Payer: BC Managed Care – PPO | Admitting: Internal Medicine

## 2013-08-03 NOTE — ED Provider Notes (Signed)
I saw and evaluated the patient, reviewed the resident's note and I agree with the findings and plan.   .Face to face Exam:  General:  Awake HEENT:  Atraumatic Resp:  Normal effort Abd:  Nondistended Neuro:No focal weakness  Nelia Shi, MD 08/03/13 [phone]

## 2013-08-12 ENCOUNTER — Other Ambulatory Visit: Payer: Self-pay | Admitting: Family Medicine

## 2013-08-30 ENCOUNTER — Encounter: Payer: Self-pay | Admitting: Internal Medicine

## 2013-08-30 ENCOUNTER — Ambulatory Visit (INDEPENDENT_AMBULATORY_CARE_PROVIDER_SITE_OTHER): Payer: BC Managed Care – PPO | Admitting: Internal Medicine

## 2013-08-30 VITALS — BP 142/98 | HR 68 | Temp 97.6°F | Ht 74.0 in | Wt 228.2 lb

## 2013-08-30 DIAGNOSIS — F17200 Nicotine dependence, unspecified, uncomplicated: Secondary | ICD-10-CM

## 2013-08-30 DIAGNOSIS — J449 Chronic obstructive pulmonary disease, unspecified: Secondary | ICD-10-CM

## 2013-08-30 DIAGNOSIS — J441 Chronic obstructive pulmonary disease with (acute) exacerbation: Secondary | ICD-10-CM

## 2013-08-30 DIAGNOSIS — I10 Essential (primary) hypertension: Secondary | ICD-10-CM

## 2013-08-30 MED ORDER — FLUTICASONE FUROATE-VILANTEROL 100-25 MCG/INH IN AEPB: 1.0000 "application " | INHALATION_SPRAY | Freq: Every day | RESPIRATORY_TRACT | Status: DC

## 2013-08-30 MED ORDER — ALBUTEROL SULFATE HFA 108 (90 BASE) MCG/ACT IN AERS: INHALATION_SPRAY | RESPIRATORY_TRACT | Status: DC

## 2013-08-30 MED ORDER — PREDNISONE 10 MG PO TABS: ORAL_TABLET | ORAL | Status: DC

## 2013-08-30 MED ORDER — TIOTROPIUM BROMIDE MONOHYDRATE 18 MCG IN CAPS: 18.0000 ug | ORAL_CAPSULE | Freq: Every day | RESPIRATORY_TRACT | Status: DC

## 2013-08-30 NOTE — Assessment & Plan Note (Addendum)
Gave proair sample, also for breo trial, and spiriva asd, f/u 4 wks

## 2013-08-30 NOTE — Assessment & Plan Note (Signed)
Urged to quit 

## 2013-08-30 NOTE — Progress Notes (Signed)
   Subjective:    Patient ID: Colin Wilson, male    DOB: 1956-03-29, 57 y.o.   MRN: [id number]  HPI  Here to f/u with wife;  Seen and tx for copd exac nov 2014 in ER, then improved with prednisone and bactrim for home, now with similar symptoms.  C/o mild cough, mild prod, with wheezing, tightness, sob/doe, overusing his alb inhaler and here due to the "inhaler nazis" at the pharmacy, asks for proair sample.  Pt denies chest pain, orthopnea, PND, increased LE swelling, palpitations, dizziness or syncope. Pt denies new neurological symptoms such as new headache, or facial or extremity weakness or numbness   Pt denies polydipsia, polyuria, Still smoking Past Medical History  Diagnosis Date  . Asthma   . Hypertension   . Heart attack   . Impaired glucose tolerance 12/12/2012   No past surgical history on file.  reports that he has been smoking.  He does not have any smokeless tobacco history on file. He reports that he drinks alcohol. He reports that he does not use illicit drugs. family history is not on file. Allergies  Allergen Reactions  . Ace Inhibitors Other (See Comments)    unknown  . Beta Adrenergic Blockers Other (See Comments)    unknown  . Doxycycline Other (See Comments)    unknown   Current Outpatient Prescriptions on File Prior to Visit  Medication Sig Dispense Refill  . amLODipine (NORVASC) 5 MG tablet Take 1 tablet (5 mg total) by mouth daily.  90 tablet  3  . aspirin 81 MG EC tablet Take 1 tablet (81 mg total) by mouth daily. Swallow whole.  30 tablet  12  . EPINEPHrine (EPIPEN) 0.3 mg/0.3 mL DEVI Inject 0.3 mLs (0.3 mg total) into the muscle once.  2 Device  1   No current facility-administered medications on file prior to visit.   Review of Systems  Constitutional: Negative for unexpected weight change, or unusual diaphoresis  HENT: Negative for tinnitus.   Eyes: Negative for photophobia and visual disturbance.  Respiratory: Negative for choking and stridor.     Gastrointestinal: Negative for vomiting and blood in stool.  Genitourinary: Negative for hematuria and decreased urine volume.  Musculoskeletal: Negative for acute joint swelling Skin: Negative for color change and wound.  Neurological: Negative for tremors and numbness other than noted  Psychiatric/Behavioral: Negative for decreased concentration or  hyperactivity.       Objective:   Physical Exam BP 142/98  Pulse 68  Temp(Src) 97.6 F (36.4 C) (Oral)  Ht 6\' 2"  (1.88 m)  Wt 228 lb 4 oz (103.534 kg)  BMI 29.29 kg/m2  SpO2 95% VS noted, not ill but uncomfortable appearing Constitutional: Pt appears well-developed and well-nourished.  HENT: Head: NCAT.  Right Ear: External ear normal.  Left Ear: External ear normal.  Eyes: Conjunctivae and EOM are normal. Pupils are equal, round, and reactive to light.  Neck: Normal range of motion. Neck supple.  Cardiovascular: Normal rate and regular rhythm.   Pulmonary/Chest: Effort normal and breath sounds decreased, no rales, few wheeze bilat  Abd:  Soft, NT, non-distended, + BS Neurological: Pt is alert. Not confused  Skin: Skin is warm. No erythema.  Psychiatric: Pt behavior is normal. Thought content normal.     Assessment & Plan:

## 2013-08-30 NOTE — Progress Notes (Signed)
Pre-visit discussion using our clinic review tool. No additional management support is needed unless otherwise documented below in the visit note.  

## 2013-08-30 NOTE — Patient Instructions (Signed)
Please take all new medication as prescribed - the prednisone Please use the Breo inhaler once daily (robin to tell you how it works) Please use the Spiriva daily as well (you can get instructions at the pharmacy)  Please continue all other medications as before, and refills have been done if requested.  Please remember to sign up for My Chart if you have not done so, as this will be important to you in the future with finding out test results, communicating by private email, and scheduling acute appointments online when needed.  Please return in 1 months, or sooner if needed

## 2013-08-30 NOTE — Assessment & Plan Note (Signed)
Mild elev today likely situational, o/w stable overall by history and exam, recent data reviewed with pt, and pt to continue medical treatment as before,  to f/u any worsening symptoms or concerns BP Readings from Last 3 Encounters:  08/30/13 142/98  07/26/13 133/96  12/12/12 132/78

## 2013-08-30 NOTE — Assessment & Plan Note (Signed)
Mild to mod, for prepack asd,  to f/u any worsening symptoms or concerns 

## 2013-09-28 ENCOUNTER — Other Ambulatory Visit: Payer: Self-pay | Admitting: Internal Medicine

## 2013-09-30 ENCOUNTER — Telehealth: Payer: Self-pay | Admitting: Internal Medicine

## 2013-09-30 NOTE — Telephone Encounter (Signed)
Relevant patient education mailed to patient.  

## 2013-10-01 ENCOUNTER — Ambulatory Visit: Payer: BC Managed Care – PPO | Admitting: Internal Medicine

## 2014-03-01 ENCOUNTER — Other Ambulatory Visit: Payer: Self-pay | Admitting: Internal Medicine

## 2014-04-23 ENCOUNTER — Other Ambulatory Visit: Payer: Self-pay | Admitting: Internal Medicine

## 2014-08-21 ENCOUNTER — Other Ambulatory Visit: Payer: Self-pay | Admitting: Internal Medicine

## 2014-08-21 NOTE — Telephone Encounter (Signed)
Needs OV today if possible (ok to work in)

## 2014-08-21 NOTE — Telephone Encounter (Signed)
Patient was informed to schedule appt. , but unable to at this time.

## 2014-08-28 ENCOUNTER — Other Ambulatory Visit (INDEPENDENT_AMBULATORY_CARE_PROVIDER_SITE_OTHER): Payer: BC Managed Care – PPO

## 2014-08-28 ENCOUNTER — Encounter: Payer: Self-pay | Admitting: Internal Medicine

## 2014-08-28 ENCOUNTER — Ambulatory Visit (INDEPENDENT_AMBULATORY_CARE_PROVIDER_SITE_OTHER): Payer: BC Managed Care – PPO | Admitting: Internal Medicine

## 2014-08-28 VITALS — BP 132/86 | HR 79 | Temp 98.0°F | Ht 74.0 in | Wt 241.0 lb

## 2014-08-28 DIAGNOSIS — R7302 Impaired glucose tolerance (oral): Secondary | ICD-10-CM

## 2014-08-28 DIAGNOSIS — Z0000 Encounter for general adult medical examination without abnormal findings: Secondary | ICD-10-CM

## 2014-08-28 DIAGNOSIS — F1010 Alcohol abuse, uncomplicated: Secondary | ICD-10-CM

## 2014-08-28 DIAGNOSIS — J449 Chronic obstructive pulmonary disease, unspecified: Secondary | ICD-10-CM

## 2014-08-28 DIAGNOSIS — J45909 Unspecified asthma, uncomplicated: Secondary | ICD-10-CM

## 2014-08-28 HISTORY — DX: Unspecified asthma, uncomplicated: J45.909

## 2014-08-28 LAB — CBC WITH DIFFERENTIAL/PLATELET
Basophils Absolute: 0 10*3/uL (ref 0.0–0.1)
Basophils Relative: 0.3 % (ref 0.0–3.0)
EOS ABS: 0.4 10*3/uL (ref 0.0–0.7)
Eosinophils Relative: 5.7 % — ABNORMAL HIGH (ref 0.0–5.0)
HEMATOCRIT: 47.1 % (ref 39.0–52.0)
HEMOGLOBIN: 16.1 g/dL (ref 13.0–17.0)
LYMPHS ABS: 2.8 10*3/uL (ref 0.7–4.0)
Lymphocytes Relative: 35.9 % (ref 12.0–46.0)
MCHC: 34.1 g/dL (ref 30.0–36.0)
MCV: 94.6 fl (ref 78.0–100.0)
Monocytes Absolute: 1.1 10*3/uL — ABNORMAL HIGH (ref 0.1–1.0)
Monocytes Relative: 14 % — ABNORMAL HIGH (ref 3.0–12.0)
NEUTROS ABS: 3.4 10*3/uL (ref 1.4–7.7)
Neutrophils Relative %: 44.1 % (ref 43.0–77.0)
Platelets: 253 10*3/uL (ref 150.0–400.0)
RBC: 4.98 Mil/uL (ref 4.22–5.81)
RDW: 12.6 % (ref 11.5–15.5)
WBC: 7.8 10*3/uL (ref 4.0–10.5)

## 2014-08-28 LAB — HEPATIC FUNCTION PANEL
ALK PHOS: 41 U/L (ref 39–117)
ALT: 25 U/L (ref 0–53)
AST: 25 U/L (ref 0–37)
Albumin: 4 g/dL (ref 3.5–5.2)
BILIRUBIN DIRECT: 0.1 mg/dL (ref 0.0–0.3)
Total Bilirubin: 0.8 mg/dL (ref 0.2–1.2)
Total Protein: 7.1 g/dL (ref 6.0–8.3)

## 2014-08-28 LAB — PSA: PSA: 0.85 ng/mL (ref 0.10–4.00)

## 2014-08-28 LAB — URINALYSIS, ROUTINE W REFLEX MICROSCOPIC
Bilirubin Urine: NEGATIVE
KETONES UR: NEGATIVE
Leukocytes, UA: NEGATIVE
Nitrite: NEGATIVE
Specific Gravity, Urine: <=1.005 — AB (ref 1.000–1.030)
Total Protein, Urine: NEGATIVE
URINE GLUCOSE: NEGATIVE
UROBILINOGEN UA: 0.2 (ref 0.0–1.0)
WBC, UA: NONE SEEN (ref 0–?)
pH: 6.5 (ref 5.0–8.0)

## 2014-08-28 LAB — HEMOGLOBIN A1C: HEMOGLOBIN A1C: 5.7 % (ref 4.6–6.5)

## 2014-08-28 LAB — LIPID PANEL
Cholesterol: 237 mg/dL — ABNORMAL HIGH (ref 0–200)
HDL: 53 mg/dL (ref >39.00)
LDL CALC: 160 mg/dL — AB (ref 0–99)
NONHDL: 184
Total CHOL/HDL Ratio: 4
Triglycerides: 118 mg/dL (ref 0.0–149.0)
VLDL: 23.6 mg/dL (ref 0.0–40.0)

## 2014-08-28 LAB — BASIC METABOLIC PANEL
BUN: 10 mg/dL (ref 6–23)
CALCIUM: 9.9 mg/dL (ref 8.4–10.5)
CO2: 27 meq/L (ref 19–32)
Chloride: 103 mEq/L (ref 96–112)
Creatinine, Ser: 0.9 mg/dL (ref 0.4–1.5)
GFR: 89.33 mL/min (ref >60.00)
Glucose, Bld: 104 mg/dL — ABNORMAL HIGH (ref 70–99)
Potassium: 4.4 mEq/L (ref 3.5–5.1)
Sodium: 139 mEq/L (ref 135–145)

## 2014-08-28 LAB — TSH: TSH: 1.85 u[IU]/mL (ref 0.35–4.50)

## 2014-08-28 MED ORDER — AMLODIPINE BESYLATE 5 MG PO TABS: 5.0000 mg | ORAL_TABLET | Freq: Every day | ORAL | Status: DC

## 2014-08-28 MED ORDER — ALBUTEROL SULFATE HFA 108 (90 BASE) MCG/ACT IN AERS: INHALATION_SPRAY | RESPIRATORY_TRACT | Status: DC

## 2014-08-28 MED ORDER — PREDNISONE 10 MG PO TABS: ORAL_TABLET | ORAL | Status: DC

## 2014-08-28 MED ORDER — PREDNISONE 5 MG PO TABS: 5.0000 mg | ORAL_TABLET | Freq: Every day | ORAL | Status: DC

## 2014-08-28 MED ORDER — TIOTROPIUM BROMIDE MONOHYDRATE 18 MCG IN CAPS: 18.0000 ug | ORAL_CAPSULE | Freq: Every day | RESPIRATORY_TRACT | Status: DC

## 2014-08-28 NOTE — Progress Notes (Signed)
Subjective:    Patient ID: Colin Wilson, male    DOB: 06/24/1956, 58 y.o.   MRN: [id number]  HPI Here for wellness and f/u;  Overall doing ok;  Pt denies CP, worsening SOB, DOE, wheezing, orthopnea, PND, worsening LE edema, palpitations, dizziness or syncope.  Pt denies neurological change such as new headache, facial or extremity weakness.  Pt denies polydipsia, polyuria, or low sugar symptoms. Pt states overall good compliance with treatment and medications, good tolerability, and has been trying to follow lower cholesterol diet.  Pt denies worsening depressive symptoms, suicidal ideation or panic. No fever, night sweats, wt loss, loss of appetite, or other constitutional symptoms.  Pt states good ability with ADL's, has low fall risk, home safety reviewed and adequate, no other significant changes in hearing or vision, and only occasionally active with exercise.  Simply cant afford  spiriva or inhaled steroid starting jan 1 due to $3000 deductible with his insurance. Asks for prednisone more daily if possible. Still drinking daily 6 beers per day. Irritable today Past Medical History  Diagnosis Date  . Asthma   . Hypertension   . Heart attack   . Impaired glucose tolerance 12/12/2012  . Asthma, chronic 08/28/2014   No past surgical history on file.  reports that he has been smoking.  He does not have any smokeless tobacco history on file. He reports that he drinks alcohol. He reports that he does not use illicit drugs. family history is not on file. Allergies  Allergen Reactions  . Ace Inhibitors Other (See Comments)    unknown  . Beta Adrenergic Blockers Other (See Comments)    unknown  . Doxycycline Other (See Comments)    unknown   Current Outpatient Prescriptions on File Prior to Visit  Medication Sig Dispense Refill  . EPIPEN 2-PAK 0.3 MG/0.3ML SOAJ injection INJECT 0.3 MLS (0.3 MG TOTAL) INTO THE MUSCLE ONCE. 2 Device 0  . predniSONE (DELTASONE) 10 MG tablet 3 tabs by mouth  per day for 3 days,2tabs per day for 3 days,1tab per day for 3 days 18 tablet 11  . tiotropium (SPIRIVA HANDIHALER) 18 MCG inhalation capsule Place 1 capsule (18 mcg total) into inhaler and inhale daily. 30 capsule 12  . aspirin 81 MG EC tablet Take 1 tablet (81 mg total) by mouth daily. Swallow whole. (Patient not taking: Reported on 08/28/2014) 30 tablet 12  . Fluticasone Furoate-Vilanterol (BREO ELLIPTA) 100-25 MCG/INH AEPB Inhale 1 application into the lungs daily. Per day (Patient not taking: Reported on 08/28/2014) 1 each 11   No current facility-administered medications on file prior to visit.    Review of Systems Constitutional: Negative for increased diaphoresis, other activity, appetite or other siginficant weight change  HENT: Negative for worsening hearing loss, ear pain, facial swelling, mouth sores and neck stiffness.   Eyes: Negative for other worsening pain, redness or visual disturbance.  Respiratory: Negative for shortness of breath and wheezing.   Cardiovascular: Negative for chest pain and palpitations.  Gastrointestinal: Negative for diarrhea, blood in stool, abdominal distention or other pain Genitourinary: Negative for hematuria, flank pain or change in urine volume.  Musculoskeletal: Negative for myalgias or other joint complaints.  Skin: Negative for color change and wound.  Neurological: Negative for syncope and numbness. other than noted Hematological: Negative for adenopathy. or other swelling Psychiatric/Behavioral: Negative for hallucinations, self-injury, decreased concentration or other worsening agitation.      Objective:   Physical Exam BP 132/86 mmHg  Pulse 79  Temp(Src)  98 F (36.7 C) (Oral)  Ht 6\' 2"  (1.88 m)  Wt 241 lb (109.317 kg)  BMI 30.93 kg/m2  SpO2 94% VS noted,  Constitutional: Pt is oriented to person, place, and time. Appears well-developed and well-nourished.  Head: Normocephalic and atraumatic.  Right Ear: External ear normal.    Left Ear: External ear normal.  Nose: Nose normal.  Mouth/Throat: Oropharynx is clear and moist.  Eyes: Conjunctivae and EOM are normal. Pupils are equal, round, and reactive to light.  Neck: Normal range of motion. Neck supple. No JVD present. No tracheal deviation present.  Cardiovascular: Normal rate, regular rhythm, normal heart sounds and intact distal pulses.   Pulmonary/Chest: Effort normal and breath sounds with few wheeze, bilat decreased  BS  Abdominal: Soft. Bowel sounds are normal. NT. No HSM  Musculoskeletal: Normal range of motion. Exhibits no edema.  Lymphadenopathy:  Has no cervical adenopathy.  Neurological: Pt is alert and oriented to person, place, and time. Pt has normal reflexes. No cranial nerve deficit. Motor grossly intact Skin: Skin is warm and dry. No rash noted.  Psychiatric:  Has irrtable mood and affect. Behavior is normal.  Wt Readings from Last 3 Encounters:  08/28/14 241 lb (109.317 kg)  08/30/13 228 lb 4 oz (103.534 kg)  12/12/12 225 lb (102.059 kg)        Assessment & Plan:

## 2014-08-28 NOTE — Patient Instructions (Signed)
Please take all new medication as prescribed - the prednisone  Please continue all other medications as before, and refills have been done if requested.  Please have the pharmacy call with any other refills you may need.  Please continue your efforts at being more active, low cholesterol diet, and weight control.  You are otherwise up to date with prevention measures today.  Please keep your appointments with your specialists as you may have planned  Please go to the LAB in the Basement (turn left off the elevator) for the tests to be done today  You will be contacted by phone if any changes need to be made immediately.  Otherwise, you will receive a letter about your results with an explanation, but please check with MyChart first.  Please remember to sign up for MyChart if you have not done so, as this will be important to you in the future with finding out test results, communicating by private email, and scheduling acute appointments online when needed.  Please return in 6 months, or sooner if needed

## 2014-08-28 NOTE — Progress Notes (Signed)
Pre visit review using our clinic review tool, if applicable. No additional management support is needed unless otherwise documented below in the visit note. 

## 2014-08-31 NOTE — Assessment & Plan Note (Signed)
Simply refuses steroid inhaler due to cost, for burst predpac, then 5 qd

## 2014-08-31 NOTE — Assessment & Plan Note (Signed)
Asympt, for a1c,  to f/u any worsening symptoms or concerns  

## 2014-08-31 NOTE — Assessment & Plan Note (Signed)

## 2014-08-31 NOTE — Assessment & Plan Note (Signed)
Urged to abstain 

## 2014-10-01 ENCOUNTER — Other Ambulatory Visit: Payer: Self-pay | Admitting: Internal Medicine

## 2014-10-01 ENCOUNTER — Telehealth: Payer: Self-pay | Admitting: Internal Medicine

## 2014-10-01 MED ORDER — PREDNISONE 10 MG PO TABS: ORAL_TABLET | ORAL | Status: DC

## 2014-10-01 NOTE — Telephone Encounter (Signed)
Ok done erx 

## 2014-10-01 NOTE — Telephone Encounter (Signed)
Pt called in requesting refill on his predniSONE (DELTASONE) 10 MG tablet [[id number]][[id number]].  He said that he was just in her in Dec.  He is not sure why he needs to come back in to get refill on this medication?

## 2014-10-01 NOTE — Telephone Encounter (Signed)
Patient stated that at his 08/28/14 appt. You gave him the 9 day treatment and stated to call back the first of January if he needed a refill of that treatment.  The patient stated this was all discussed at the OV and he is doing only what he was instructed.

## 2014-10-01 NOTE — Telephone Encounter (Signed)
Sorry for the inconvenience, but prednisone taper dosing as he had in dec 2015 is not usually a refillable chronic medication  Prednisone may not be the best or even the only treatment needed  For example, if has fever or evidence for infection, he might need an antibiotic  Other concern would be that if he needs frequent prednisone dosing (which is not a good idea), he may need a change in inhalers with steroid in it, to avoid repeated prednisone  thanks

## 2014-10-01 NOTE — Telephone Encounter (Signed)
Patient informed. 

## 2014-10-17 ENCOUNTER — Ambulatory Visit (INDEPENDENT_AMBULATORY_CARE_PROVIDER_SITE_OTHER): Payer: BLUE CROSS/BLUE SHIELD | Admitting: Internal Medicine

## 2014-10-17 ENCOUNTER — Encounter: Payer: Self-pay | Admitting: Internal Medicine

## 2014-10-17 VITALS — BP 154/98 | HR 73 | Temp 97.6°F | Ht 74.0 in | Wt 226.0 lb

## 2014-10-17 DIAGNOSIS — J449 Chronic obstructive pulmonary disease, unspecified: Secondary | ICD-10-CM

## 2014-10-17 DIAGNOSIS — I10 Essential (primary) hypertension: Secondary | ICD-10-CM

## 2014-10-17 DIAGNOSIS — J441 Chronic obstructive pulmonary disease with (acute) exacerbation: Secondary | ICD-10-CM

## 2014-10-17 DIAGNOSIS — J209 Acute bronchitis, unspecified: Secondary | ICD-10-CM

## 2014-10-17 MED ORDER — FLUTICASONE-SALMETEROL 500-50 MCG/DOSE IN AEPB: 1.0000 | INHALATION_SPRAY | Freq: Two times a day (BID) | RESPIRATORY_TRACT | Status: DC

## 2014-10-17 MED ORDER — HYDROCODONE-HOMATROPINE 5-1.5 MG/5ML PO SYRP: 5.0000 mL | ORAL_SOLUTION | Freq: Four times a day (QID) | ORAL | Status: DC | PRN

## 2014-10-17 MED ORDER — LEVOFLOXACIN 250 MG PO TABS: 250.0000 mg | ORAL_TABLET | Freq: Every day | ORAL | Status: DC

## 2014-10-17 MED ORDER — METHYLPREDNISOLONE ACETATE 80 MG/ML IJ SUSP
80.0000 mg | Freq: Once | INTRAMUSCULAR | Status: AC
  Administered 2014-10-17: 80 mg via INTRAMUSCULAR

## 2014-10-17 MED ORDER — PREDNISONE 10 MG PO TABS: ORAL_TABLET | ORAL | Status: DC

## 2014-10-17 MED ORDER — ALBUTEROL SULFATE HFA 108 (90 BASE) MCG/ACT IN AERS: INHALATION_SPRAY | RESPIRATORY_TRACT | Status: DC

## 2014-10-17 NOTE — Patient Instructions (Signed)
You had the steroid shot today  Please take all new medication as prescribed - the antibiotic, cough medicine, prednisone, and advair  Please continue all other medications as before, including the Albuterol inhaler as needed  Please have the pharmacy call with any other refills you may need  Please keep your appointments with your specialists as you may have planned

## 2014-10-17 NOTE — Progress Notes (Signed)
Pre visit review using our clinic review tool, if applicable. No additional management support is needed unless otherwise documented below in the visit note. 

## 2014-10-18 DIAGNOSIS — J209 Acute bronchitis, unspecified: Secondary | ICD-10-CM | POA: Insufficient documentation

## 2014-10-18 DIAGNOSIS — J441 Chronic obstructive pulmonary disease with (acute) exacerbation: Secondary | ICD-10-CM | POA: Insufficient documentation

## 2014-10-18 NOTE — Assessment & Plan Note (Signed)
Mild to mod, for depomedrol IM, predpac asd, to f/u any worsening symptoms or concerns 

## 2014-10-18 NOTE — Assessment & Plan Note (Signed)
elev today mild, liekly reactive, o/w stable overall by history and exam, recent data reviewed with pt, and pt to continue medical treatment as before,  to f/u any worsening symptoms or concerns BP Readings from Last 3 Encounters:  10/17/14 154/98  08/28/14 132/86  08/30/13 142/98

## 2014-10-18 NOTE — Assessment & Plan Note (Signed)
Mild to mod, for antibx course,  to f/u any worsening symptoms or concerns 

## 2014-10-18 NOTE — Progress Notes (Signed)
   Subjective:    Patient ID: Colin Wilson, male    DOB: 05/10/1956, 59 y.o.   MRN: [id number]  HPI Here with acute onset mild to mod 2-3 days ST, HA, general weakness and malaise, with prod cough greenish sputum, but Pt denies chest pain, increased sob or doe, wheezing, orthopnea, PND, increased LE swelling, palpitations, dizziness or syncope, with onset worsening wheezing/sob x 5 days. Pt denies new neurological symptoms such as new headache, or facial or extremity weakness or numbness   Pt denies polydipsia, polyuria Past Medical History  Diagnosis Date  . Asthma   . Hypertension   . Heart attack   . Impaired glucose tolerance 12/12/2012  . Asthma, chronic 08/28/2014   No past surgical history on file.  reports that he has been smoking.  He does not have any smokeless tobacco history on file. He reports that he drinks alcohol. He reports that he does not use illicit drugs. family history is not on file. Allergies  Allergen Reactions  . Ace Inhibitors Other (See Comments)    unknown  . Beta Adrenergic Blockers Other (See Comments)    unknown  . Doxycycline Other (See Comments)    unknown   Current Outpatient Prescriptions on File Prior to Visit  Medication Sig Dispense Refill  . amLODipine (NORVASC) 5 MG tablet Take 1 tablet (5 mg total) by mouth daily. 30 tablet 12  . aspirin 81 MG EC tablet Take 1 tablet (81 mg total) by mouth daily. Swallow whole. 30 tablet 12  . EPIPEN 2-PAK 0.3 MG/0.3ML SOAJ injection INJECT 0.3 MLS (0.3 MG TOTAL) INTO THE MUSCLE ONCE. 2 Device 0  . predniSONE (DELTASONE) 5 MG tablet Take 1 tablet (5 mg total) by mouth daily with breakfast. 90 tablet 3  . tiotropium (SPIRIVA HANDIHALER) 18 MCG inhalation capsule Place 1 capsule (18 mcg total) into inhaler and inhale daily. 30 capsule 12   No current facility-administered medications on file prior to visit.   Review of Systems  Constitutional: Negative for unusual diaphoresis or other sweats  HENT: Negative  for ringing in ear Eyes: Negative for double vision or worsening visual disturbance.  Respiratory: Negative for choking and stridor.   Gastrointestinal: Negative for vomiting or other signifcant bowel change Genitourinary: Negative for hematuria or decreased urine volume.  Musculoskeletal: Negative for other MSK pain or swelling Skin: Negative for color change and worsening wound.  Neurological: Negative for tremors and numbness other than noted  Psychiatric/Behavioral: Negative for decreased concentration or agitation other than above       Objective:   Physical Exam BP 154/98 mmHg  Pulse 73  Temp(Src) 97.6 F (36.4 C) (Oral)  Ht 6\' 2"  (1.88 m)  Wt 226 lb (102.513 kg)  BMI 29.00 kg/m2  SpO2 95% VS noted, mild ill  Constitutional: Pt appears well-developed, well-nourished.  HENT: Head: NCAT.  Right Ear: External ear normal.  Left Ear: External ear normal.  Eyes: . Pupils are equal, round, and reactive to light. Conjunctivae and EOM are normal Bilat tm's with mild erythema.  Max sinus areas non tender.  Pharynx with mild erythema, no exudate Neck: Normal range of motion. Neck supple.  Cardiovascular: Normal rate and regular rhythm.   Pulmonary/Chest: Effort normal and breath sounds decreased without rales but with mild bilat wheezing.  Neurological: Pt is alert. Not confused , motor grossly intact Skin: Skin is warm. No rash Psychiatric: Pt behavior is normal. No agitation.     Assessment & Plan:

## 2014-10-18 NOTE — Assessment & Plan Note (Signed)
Also add advair 500/50 bid, hopefully to be able to reduce po steroid dependence, has not been able to afford symbicort in past

## 2014-12-11 ENCOUNTER — Other Ambulatory Visit: Payer: Self-pay | Admitting: Internal Medicine

## 2014-12-11 ENCOUNTER — Telehealth: Payer: Self-pay | Admitting: Internal Medicine

## 2014-12-11 NOTE — Telephone Encounter (Signed)
OTC zyrtec and nasacort are a good start since these are same as prescription,  I can refer to allergy if he wants

## 2014-12-11 NOTE — Telephone Encounter (Signed)
Pt called and just needs something short term to help with allergies.  He said that he has a 3,000 deduct able  and can not afford to come in.  What should he do?  Told him that he would need to come in for a OV but he says that he cant afford too.

## 2014-12-12 MED ORDER — PREDNISONE 10 MG PO TABS: ORAL_TABLET | ORAL | Status: DC

## 2014-12-12 NOTE — Telephone Encounter (Signed)
Ok this time only, but usually not done by phone request  Please make rov for worsening fever, cough, wheezing, sob, or pain

## 2014-12-12 NOTE — Telephone Encounter (Signed)
Patient wants to know if he can have another round of prednisone. He said it was only thing to help his breathing during this difficult allergy season.

## 2014-12-12 NOTE — Telephone Encounter (Signed)
Patient informed. 

## 2015-06-22 IMAGING — CR DG CHEST 2V
2 series · 2 of 2 positions shown · non-contrast
Comparison: 02/05/2012 and earlier.

CLINICAL DATA: 57-year-old male with shortness of breath, worsening
cough. Initial encounter.

EXAM:
CHEST  2 VIEW

[w chest pa]
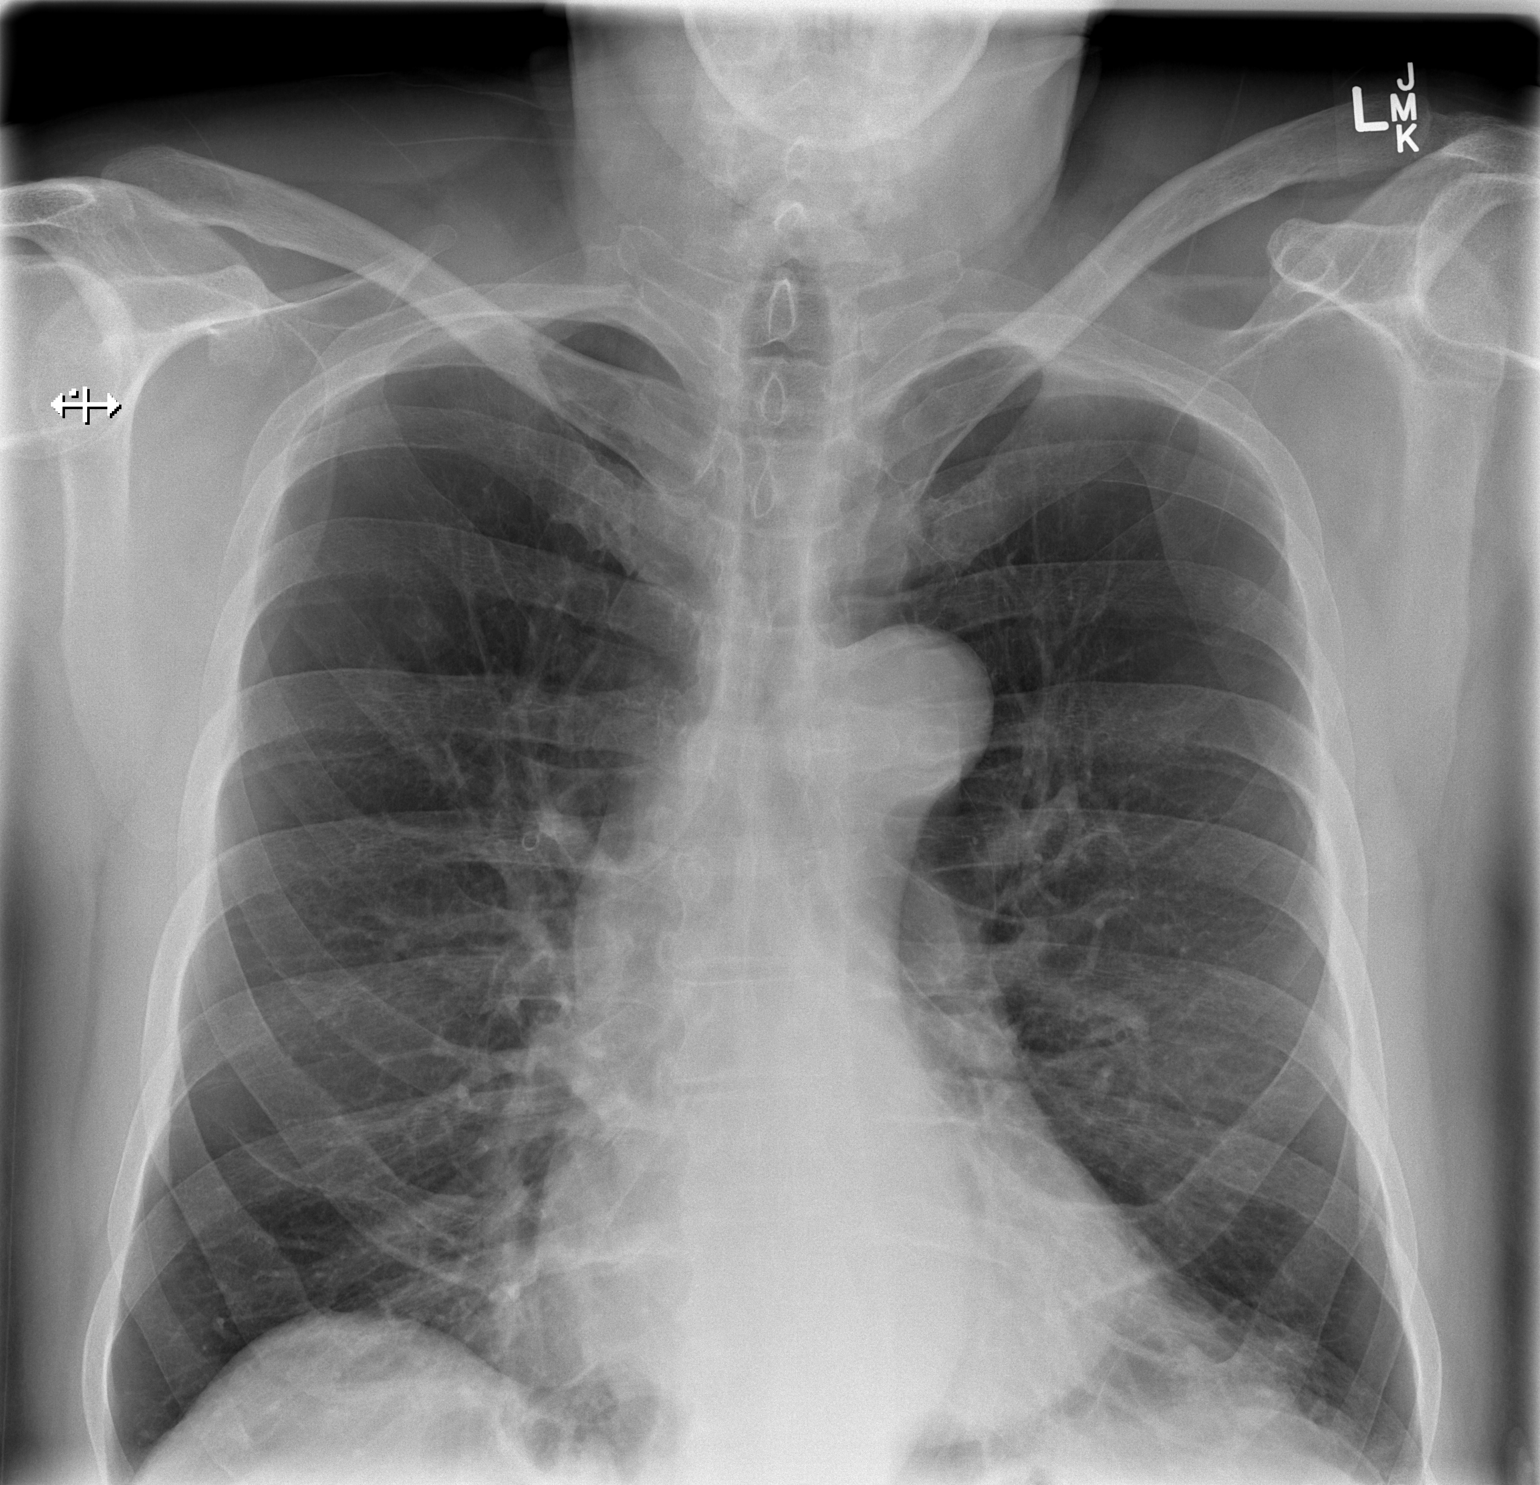

[w chest lat]
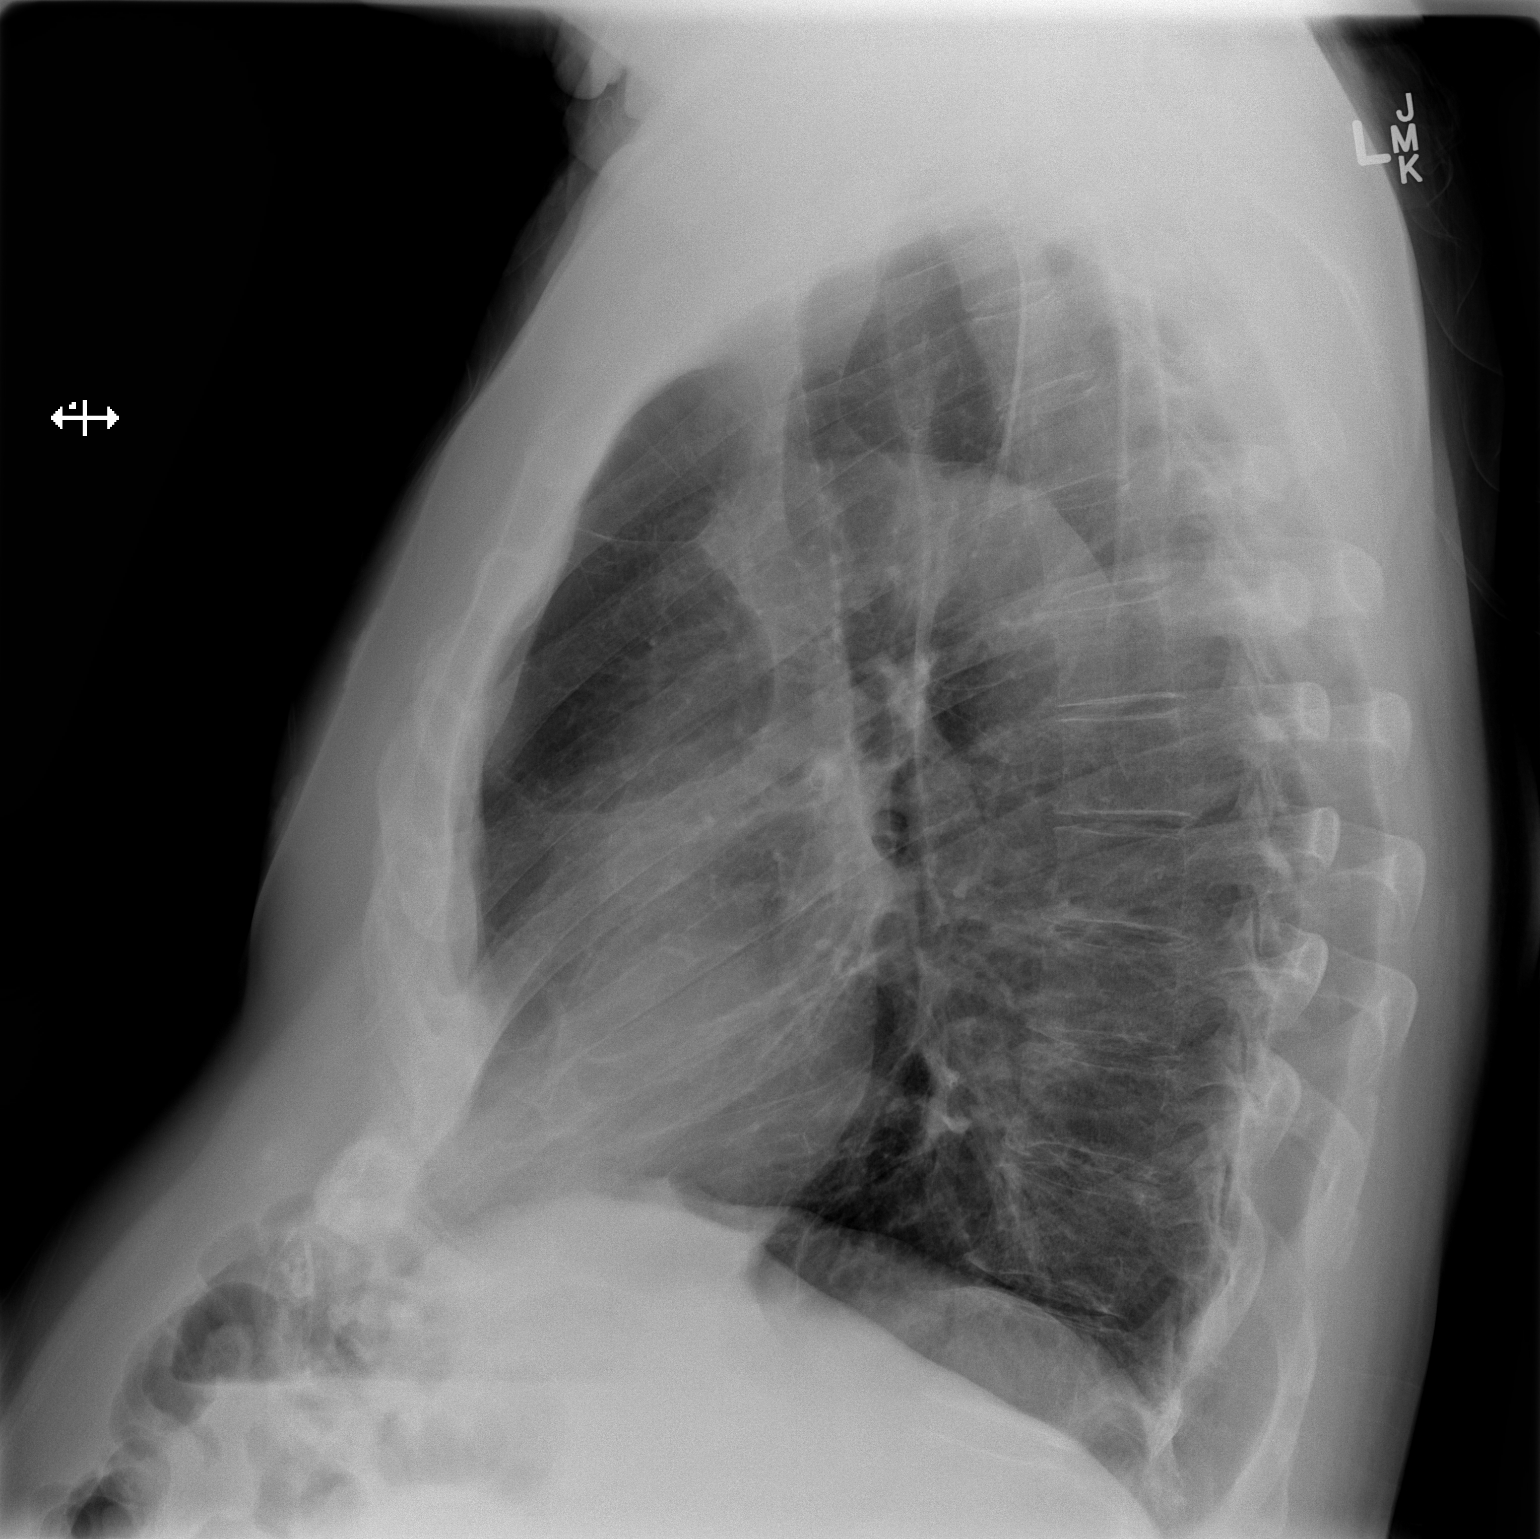

[2 of 2 positions shown; findings below may reference images not displayed]

FINDINGS: Larger lung volumes. Tortuous thoracic aorta. Other mediastinal
contours are within normal limits. Visualized tracheal air column is
within normal limits. No pneumothorax or pulmonary edema. No pleural
effusion or confluent pulmonary opacity. Attenuation of upper lobe
vascular markings suggesting emphysema. No acute osseous abnormality
identified.
IMPRESSION: No acute cardiopulmonary abnormality.  Emphysema suspected.

## 2015-08-24 ENCOUNTER — Other Ambulatory Visit: Payer: Self-pay | Admitting: Internal Medicine

## 2015-08-26 ENCOUNTER — Other Ambulatory Visit: Payer: Self-pay | Admitting: Internal Medicine

## 2015-09-22 ENCOUNTER — Other Ambulatory Visit: Payer: Self-pay | Admitting: Internal Medicine

## 2015-10-08 ENCOUNTER — Telehealth: Payer: Self-pay | Admitting: Internal Medicine

## 2015-10-08 ENCOUNTER — Telehealth: Payer: Self-pay

## 2015-10-08 NOTE — Telephone Encounter (Signed)
Patients inhaler is no longer covered by his insurance, could you prescribe an alternative. Please advise.

## 2015-10-08 NOTE — Telephone Encounter (Signed)
Pt's spouse called stated that [employer] will not cover albuterol (VENTOLIN HFA) 108 (90 BASE) MCG/ACT inhaler nor Ventolin,Xopenex. Please advise the alternative so insurance will cover. Pt's spouse will be faxing the list of med that they will not cover (they will not tell her which one they cover). For [place] to look and compare.

## 2015-10-09 ENCOUNTER — Telehealth: Payer: Self-pay

## 2015-10-09 MED ORDER — LEVALBUTEROL TARTRATE 45 MCG/ACT IN AERO: 2.0000 | INHALATION_SPRAY | Freq: Four times a day (QID) | RESPIRATORY_TRACT | Status: DC | PRN

## 2015-10-09 MED ORDER — ALBUTEROL SULFATE HFA 108 (90 BASE) MCG/ACT IN AERS: 2.0000 | INHALATION_SPRAY | Freq: Four times a day (QID) | RESPIRATORY_TRACT | Status: DC | PRN

## 2015-10-09 MED ORDER — BUDESONIDE-FORMOTEROL FUMARATE 160-4.5 MCG/ACT IN AERO: 2.0000 | INHALATION_SPRAY | Freq: Two times a day (BID) | RESPIRATORY_TRACT | Status: DC

## 2015-10-09 NOTE — Telephone Encounter (Signed)
Called patient to inform him that Dr. Jonny Ruiz has changed his prescription to symbicort.

## 2015-10-09 NOTE — Telephone Encounter (Signed)
Patient called back and stated that he is only on one inhaler and was not taking advair. His insurance told him that they would not be covering his albuterol ventolin HFA. He is going to contact his insurance company to see which they will cover.

## 2015-10-09 NOTE — Telephone Encounter (Signed)
The options would be Proventil  Or Proair inhalers  Just let me know which would be covered, or we could just try to send one anyway

## 2015-10-09 NOTE — Telephone Encounter (Signed)
Patient States proventil is not covered under insurance. Needs levanlbuterol sent instead to [facility] on [address].

## 2015-10-09 NOTE — Addendum Note (Signed)
Addended by: Corwin Levins on: 10/09/2015 03:11 PM   Modules accepted: Orders

## 2015-10-09 NOTE — Telephone Encounter (Signed)
States proventil is not covered under insurance.  Needs levanlbuterol sent instead to [facility] on [address].

## 2015-10-09 NOTE — Telephone Encounter (Signed)
Since the patient is on 3 inhalers, I am guessing that maybe this refers to the advair  Since I could not know what his insurance will cover, I will guess at a change of advair to symbicort  Please inform pt, and let me know if this needs to be changed further

## 2015-10-09 NOTE — Telephone Encounter (Signed)
Please call patient back in regards  °

## 2015-10-09 NOTE — Telephone Encounter (Signed)
Please send in proventil

## 2015-10-09 NOTE — Addendum Note (Signed)
Addended by: Corwin Levins on: 10/09/2015 05:22 AM   Modules accepted: Orders, Medications

## 2015-10-09 NOTE — Telephone Encounter (Signed)
proventil done erx

## 2015-10-09 NOTE — Telephone Encounter (Signed)
Ok for levaalbuterol - done erx

## 2015-11-22 ENCOUNTER — Other Ambulatory Visit: Payer: Self-pay | Admitting: Internal Medicine

## 2015-11-25 ENCOUNTER — Other Ambulatory Visit: Payer: Self-pay | Admitting: Internal Medicine

## 2016-03-07 ENCOUNTER — Other Ambulatory Visit: Payer: Self-pay | Admitting: Internal Medicine

## 2016-05-19 ENCOUNTER — Other Ambulatory Visit: Payer: Self-pay | Admitting: Internal Medicine

## 2016-06-07 ENCOUNTER — Other Ambulatory Visit: Payer: Self-pay | Admitting: Internal Medicine

## 2016-06-08 ENCOUNTER — Other Ambulatory Visit: Payer: Self-pay | Admitting: Internal Medicine

## 2016-07-11 ENCOUNTER — Other Ambulatory Visit: Payer: Self-pay | Admitting: *Deleted

## 2016-07-11 NOTE — Telephone Encounter (Signed)
Pharmacist left msg on triage stating there has been confusion w/pt inhalers. Pt requesting to get rx for Proair & Ventolin. ? If pt should be taking 2 rescue inhalers w/out taking something daily. Called pharmacy spoke w/allison denied refill pt is overdue for appt last saw MD 2/16. Will need ov for approval.../lmb

## 2016-07-13 ENCOUNTER — Other Ambulatory Visit: Payer: Self-pay | Admitting: Internal Medicine

## 2016-08-18 ENCOUNTER — Other Ambulatory Visit: Payer: Self-pay | Admitting: Internal Medicine

## 2016-09-22 ENCOUNTER — Other Ambulatory Visit: Payer: Self-pay | Admitting: Internal Medicine

## 2016-09-23 NOTE — Telephone Encounter (Signed)
Unfortunately we are unable to refill the prednisone  Pt last seen 23 months ago.    Please ask pt to make ROV

## 2016-09-23 NOTE — Telephone Encounter (Signed)
Very sorry, due to office policy I cannot refill medication

## 2016-09-23 NOTE — Telephone Encounter (Signed)
Routing to dr Jonny Ruizjohn, patient's last office visit was 10/2014----are you ok with refilling or do you need office visit scheduled---please advise, thanks

## 2016-09-23 NOTE — Telephone Encounter (Signed)
Spoke with Colin Wilson,offer an appt and massage below. Colin Wilson stated the reason he hasnt come in for the appt is due to no insurance/money. Colin Wilson stated Dr. Jonny RuizJohn was the one who put him on this med, and he cant just come in because no insurance. No appt made.

## 2016-09-23 NOTE — Telephone Encounter (Signed)
Left message advising patient to call back if he needs med refilled----let tamara know when appt is made so that refill can be sent

## 2016-09-24 ENCOUNTER — Other Ambulatory Visit: Payer: Self-pay | Admitting: Internal Medicine

## 2016-10-10 ENCOUNTER — Other Ambulatory Visit: Payer: Self-pay | Admitting: Internal Medicine

## 2016-10-28 ENCOUNTER — Telehealth: Payer: Self-pay

## 2016-10-28 MED ORDER — ALBUTEROL SULFATE HFA 108 (90 BASE) MCG/ACT IN AERS
2.0000 | INHALATION_SPRAY | Freq: Four times a day (QID) | RESPIRATORY_TRACT | 3 refills | Status: DC | PRN
Start: 2016-10-28 — End: 2016-12-09

## 2016-10-28 NOTE — Telephone Encounter (Signed)
I would not feel further comfortable with this rx as pt was last seen feb 2016  Please consider ROV

## 2016-10-28 NOTE — Telephone Encounter (Signed)
Pt spouse called and rq rx for albuterol and prednisone.   Inhaler sent in but need advisement on the prednisone.

## 2016-10-29 NOTE — Telephone Encounter (Signed)
Tried to contact pt but not able to leave vm on home number.

## 2016-12-06 ENCOUNTER — Telehealth: Payer: Self-pay | Admitting: Internal Medicine

## 2016-12-06 MED ORDER — PREDNISONE 10 MG PO TABS: ORAL_TABLET | ORAL | 0 refills | Status: DC

## 2016-12-06 NOTE — Telephone Encounter (Signed)
3 days prednisone sent erx

## 2016-12-06 NOTE — Telephone Encounter (Signed)
Pt.notified

## 2016-12-06 NOTE — Telephone Encounter (Signed)
Pt cannot stop wheezing would like to be called in a couple days of prednisone to get through to his appointment.

## 2016-12-09 ENCOUNTER — Encounter: Payer: Self-pay | Admitting: Internal Medicine

## 2016-12-09 ENCOUNTER — Other Ambulatory Visit (INDEPENDENT_AMBULATORY_CARE_PROVIDER_SITE_OTHER): Payer: BLUE CROSS/BLUE SHIELD

## 2016-12-09 ENCOUNTER — Telehealth: Payer: Self-pay

## 2016-12-09 ENCOUNTER — Ambulatory Visit (INDEPENDENT_AMBULATORY_CARE_PROVIDER_SITE_OTHER): Payer: BLUE CROSS/BLUE SHIELD | Admitting: Internal Medicine

## 2016-12-09 VITALS — BP 132/86 | HR 77 | Temp 98.0°F | Ht 74.0 in | Wt 218.0 lb

## 2016-12-09 DIAGNOSIS — E785 Hyperlipidemia, unspecified: Secondary | ICD-10-CM

## 2016-12-09 DIAGNOSIS — J441 Chronic obstructive pulmonary disease with (acute) exacerbation: Secondary | ICD-10-CM

## 2016-12-09 DIAGNOSIS — R7302 Impaired glucose tolerance (oral): Secondary | ICD-10-CM

## 2016-12-09 DIAGNOSIS — Z7952 Long term (current) use of systemic steroids: Secondary | ICD-10-CM

## 2016-12-09 DIAGNOSIS — R140 Abdominal distension (gaseous): Secondary | ICD-10-CM

## 2016-12-09 DIAGNOSIS — Z0001 Encounter for general adult medical examination with abnormal findings: Secondary | ICD-10-CM | POA: Diagnosis not present

## 2016-12-09 DIAGNOSIS — Z0000 Encounter for general adult medical examination without abnormal findings: Secondary | ICD-10-CM

## 2016-12-09 DIAGNOSIS — I10 Essential (primary) hypertension: Secondary | ICD-10-CM

## 2016-12-09 DIAGNOSIS — J449 Chronic obstructive pulmonary disease, unspecified: Secondary | ICD-10-CM | POA: Diagnosis not present

## 2016-12-09 LAB — URINALYSIS, ROUTINE W REFLEX MICROSCOPIC
BILIRUBIN URINE: NEGATIVE
Hgb urine dipstick: NEGATIVE
KETONES UR: NEGATIVE
Leukocytes, UA: NEGATIVE
NITRITE: NEGATIVE
PH: 6.5 (ref 5.0–8.0)
Total Protein, Urine: NEGATIVE
URINE GLUCOSE: NEGATIVE
Urobilinogen, UA: 0.2 (ref 0.0–1.0)
WBC, UA: NONE SEEN — AB (ref 0–?)

## 2016-12-09 LAB — BASIC METABOLIC PANEL
BUN: 8 mg/dL (ref 6–23)
CO2: 33 meq/L — AB (ref 19–32)
CREATININE: 0.86 mg/dL (ref 0.40–1.50)
Calcium: 9.5 mg/dL (ref 8.4–10.5)
Chloride: 101 mEq/L (ref 96–112)
GFR: 95.83 mL/min (ref >60.00)
Glucose, Bld: 102 mg/dL — ABNORMAL HIGH (ref 70–99)
Potassium: 4.5 mEq/L (ref 3.5–5.1)
Sodium: 139 mEq/L (ref 135–145)

## 2016-12-09 LAB — CBC WITH DIFFERENTIAL/PLATELET
BASOS PCT: 0.5 % (ref 0.0–3.0)
Basophils Absolute: 0 10*3/uL (ref 0.0–0.1)
EOS ABS: 0.2 10*3/uL (ref 0.0–0.7)
Eosinophils Relative: 2.7 % (ref 0.0–5.0)
HCT: 48.2 % (ref 39.0–52.0)
HEMOGLOBIN: 16.8 g/dL (ref 13.0–17.0)
LYMPHS ABS: 3.3 10*3/uL (ref 0.7–4.0)
Lymphocytes Relative: 36.7 % (ref 12.0–46.0)
MCHC: 34.9 g/dL (ref 30.0–36.0)
MCV: 93.2 fl (ref 78.0–100.0)
MONO ABS: 1.2 10*3/uL — AB (ref 0.1–1.0)
Monocytes Relative: 13.4 % — ABNORMAL HIGH (ref 3.0–12.0)
NEUTROS PCT: 46.7 % (ref 43.0–77.0)
Neutro Abs: 4.3 10*3/uL (ref 1.4–7.7)
Platelets: 266 10*3/uL (ref 150.0–400.0)
RBC: 5.18 Mil/uL (ref 4.22–5.81)
RDW: 12.4 % (ref 11.5–15.5)
WBC: 9.1 10*3/uL (ref 4.0–10.5)

## 2016-12-09 LAB — HEPATIC FUNCTION PANEL
ALBUMIN: 4.4 g/dL (ref 3.5–5.2)
ALK PHOS: 40 U/L (ref 39–117)
ALT: 33 U/L (ref 0–53)
AST: 26 U/L (ref 0–37)
Bilirubin, Direct: 0.1 mg/dL (ref 0.0–0.3)
Total Bilirubin: 0.7 mg/dL (ref 0.2–1.2)
Total Protein: 7.4 g/dL (ref 6.0–8.3)

## 2016-12-09 LAB — LIPID PANEL
Cholesterol: 225 mg/dL — ABNORMAL HIGH (ref 0–200)
HDL: 71.9 mg/dL (ref >39.00)
LDL Cholesterol: 140 mg/dL — ABNORMAL HIGH (ref 0–99)
NONHDL: 152.61
TRIGLYCERIDES: 65 mg/dL (ref 0.0–149.0)
Total CHOL/HDL Ratio: 3
VLDL: 13 mg/dL (ref 0.0–40.0)

## 2016-12-09 LAB — TSH: TSH: 2.43 u[IU]/mL (ref 0.35–4.50)

## 2016-12-09 LAB — HEMOGLOBIN A1C: HEMOGLOBIN A1C: 5.6 % (ref 4.6–6.5)

## 2016-12-09 LAB — VITAMIN D 25 HYDROXY (VIT D DEFICIENCY, FRACTURES): VITD: 8.61 ng/mL — AB (ref 30.00–100.00)

## 2016-12-09 LAB — PSA: PSA: 1.36 ng/mL (ref 0.10–4.00)

## 2016-12-09 MED ORDER — BUDESONIDE-FORMOTEROL FUMARATE 160-4.5 MCG/ACT IN AERO: 2.0000 | INHALATION_SPRAY | Freq: Two times a day (BID) | RESPIRATORY_TRACT | 11 refills | Status: DC

## 2016-12-09 MED ORDER — PREDNISONE 10 MG PO TABS: ORAL_TABLET | ORAL | 5 refills | Status: DC

## 2016-12-09 MED ORDER — ALBUTEROL SULFATE HFA 108 (90 BASE) MCG/ACT IN AERS: 2.0000 | INHALATION_SPRAY | Freq: Four times a day (QID) | RESPIRATORY_TRACT | 11 refills | Status: DC | PRN

## 2016-12-09 MED ORDER — METHYLPREDNISOLONE ACETATE 80 MG/ML IJ SUSP
80.0000 mg | Freq: Once | INTRAMUSCULAR | Status: AC
  Administered 2016-12-09: 80 mg via INTRAMUSCULAR

## 2016-12-09 MED ORDER — AMLODIPINE BESYLATE 5 MG PO TABS: ORAL_TABLET | ORAL | 11 refills | Status: DC

## 2016-12-09 NOTE — Progress Notes (Signed)
Pre visit review using our clinic review tool, if applicable. No additional management support is needed unless otherwise documented below in the visit note. 

## 2016-12-09 NOTE — Patient Instructions (Addendum)
Your EKG was OK today  You had the steroid shot today  Please take all new medication as prescribed - the symbicort  Please continue all other medications as before, and refills have been done if requested including the prednisone  Please have the pharmacy call with any other refills you may need.  Please continue your efforts at being more active, low cholesterol diet, and weight control.  You are otherwise up to date with prevention measures today.  Please keep your appointments with your specialists as you may have planned  Please call if you change your mind about having the Abdomen ultrasound done to check the liver and any fluid in the abdomen  You will be contacted regarding the referral for: pulmonary  Please go to the LAB in the Basement (turn left off the elevator) for the tests to be done today  You will be contacted by phone if any changes need to be made immediately.  Otherwise, you will receive a letter about your results with an explanation, but please check with MyChart first.  Please remember to sign up for MyChart if you have not done so, as this will be important to you in the future with finding out test results, communicating by private email, and scheduling acute appointments online when needed.  Please return in 6 months, or sooner if needed .

## 2016-12-09 NOTE — Telephone Encounter (Signed)
-----   Message from Corwin Levins, MD sent at 12/09/2016 12:33 PM EDT ----- .Letter sent, cont same tx except  The test results show that your current treatment is OK, except the Vit D level is VERY low, and the LDL cholesterol is still moderately high.  Please let us know if you would consider taking a statin such as Lipitor for the cholesterol, but otherwise I would definitely take OTC Vit D 2000 units per day, every day, forever.     Lakiya Cottam to please inform pt, pt at least needs to start taking Vit D every day

## 2016-12-09 NOTE — Telephone Encounter (Signed)
Called pt, LVM.   

## 2016-12-09 NOTE — Telephone Encounter (Signed)
-----   Message from James W John, MD sent at 12/09/2016 12:33 PM EDT ----- .Letter sent, cont same tx except  The test results show that your current treatment is OK, except the Vit D level is VERY low, and the LDL cholesterol is still moderately high.  Please let us know if you would consider taking a statin such as Lipitor for the cholesterol, but otherwise I would definitely take OTC Vit D 2000 units per day, every day, forever.     Shirron to please inform pt, pt at least needs to start taking Vit D every day 

## 2016-12-09 NOTE — Telephone Encounter (Signed)
Patient was given lab results and expressed understanding

## 2016-12-09 NOTE — Progress Notes (Signed)
Subjective:    Patient ID: Colin Wilson, male    DOB: [birth year], 61 y.o.   MRN: [id number]  HPI  Here to f/u for med refills after lost to f/u for 2 yrs and refills denied without OV; is irritable, appears chronically depressed and admits to 6-8 beers per day.  Has lost significant wt with poor diet other than alcohol.  Has very limited resources, not working and c/o marked wheezing, sob/doe and weakness unless he take daily prednisone.  Cannot afford steroid inhaler or other tx or tesitng due to $3000 deductible insurance.  Denies worsening reflux, abd pain, dysphagia, n/v, bowel change or blood, but has ahd worsening abd distension in the past few months - ?fluid vs fat gain.  Here for wellness and f/u;  Overall doing ok;  Pt denies Chest pain, orthopnea, PND, worsening LE edema, palpitations, dizziness or syncope.  Pt denies neurological change such as new headache, facial or extremity weakness.  Pt denies polydipsia, polyuria, or low sugar symptoms. Pt states overall good compliance with treatment and medications, good tolerability, and has been trying to follow appropriate diet.  Pt denies worsening depressive symptoms, suicidal ideation or panic except feeling down that he cannot work. . No fever, night sweats, loss of appetite, or other constitutional symptoms.  Pt states good ability with ADL's, has low fall risk, home safety reviewed and adequate, no other significant changes in hearing or vision, and only occasionally active with exercise.  Wt Readings from Last 3 Encounters:  12/09/16 218 lb (98.9 kg)  10/17/14 226 lb (102.5 kg)  08/28/14 241 lb (109.3 kg)  Hsas $3000 decductible insurance and does not want testing if possible. Declines colonscopy, dxa.  Past Medical History:  Diagnosis Date  . Asthma   . Asthma, chronic 08/28/2014  . Heart attack   . Hypertension   . Impaired glucose tolerance 12/12/2012   No past surgical history on file.  reports that he has been smoking.  He  has never used smokeless tobacco. He reports that he drinks alcohol. He reports that he does not use drugs. family history is not on file. Allergies  Allergen Reactions  . Ace Inhibitors Other (See Comments)    unknown  . Beta Adrenergic Blockers Other (See Comments)    unknown  . Doxycycline Other (See Comments)    unknown   Current Outpatient Prescriptions on File Prior to Visit  Medication Sig Dispense Refill  . aspirin 81 MG EC tablet Take 1 tablet (81 mg total) by mouth daily. Swallow whole. 30 tablet 12   No current facility-administered medications on file prior to visit.    Review of Systems Constitutional: Negative for other unusual diaphoresis, sweats, appetite or weight changes HENT: Negative for other worsening hearing loss, ear pain, facial swelling, mouth sores or neck stiffness.   Eyes: Negative for other worsening pain, redness or other visual disturbance.  Respiratory: Negative for other stridor or swelling Cardiovascular: Negative for other palpitations or other chest pain  Gastrointestinal: Negative for worsening diarrhea or loose stools, blood in stool, distention or other pain Genitourinary: Negative for hematuria, flank pain or other change in urine volume.  Musculoskeletal: Negative for myalgias or other joint swelling.  Skin: Negative for other color change, or other wound or worsening drainage.  Neurological: Negative for other syncope or numbness. Hematological: Negative for other adenopathy or swelling Psychiatric/Behavioral: Negative for hallucinations, other worsening agitation, SI, self-injury, or new decreased concentration All other system neg per pt  Objective:   Physical Exam BP 132/86   Pulse 77   Temp 98 F (36.7 C) (Oral)   Ht  (1.88 m)   Wt 218 lb (98.9 kg)   SpO2 96%   BMI 27.99 kg/m  VS noted,  Constitutional: Pt is oriented to person, place, and time. Appears well-developed and well-nourished, in no significant distress and  comfortable Head: Normocephalic and atraumatic  Eyes: Conjunctivae and EOM are normal. Pupils are equal, round, and reactive to light Right Ear: External ear normal without discharge Left Ear: External ear normal without discharge Nose: Nose without discharge or deformity Mouth/Throat: Oropharynx is without other ulcerations and moist  Neck: Normal range of motion. Neck supple. No JVD present. No tracheal deviation present or significant neck LA or mass Cardiovascular: Normal rate, regular rhythm, normal heart sounds and intact distal pulses.   Pulmonary/Chest: WOB normal and breath sounds decreased with few scattered bilat wheezing but no without rales Abdominal: Soft. Bowel sounds are normal. NT. No HSM , has no obvious ascites fluid wave but has I suspect fatty abd tissue large compared to overall reduced muscle mass throughout Musculoskeletal: Normal range of motion. Exhibits no edema Lymphadenopathy: Has no other cervical adenopathy.  Neurological: Pt is alert and oriented to person, place, and time. Pt has normal reflexes. No cranial nerve deficit. Motor grossly intact, Gait intact Skin: Skin is warm and dry. No rash noted or new ulcerations Psychiatric:  Has irritable dysphoric mood and affect. Behavior is normal without agitation No other exam findings  ECG today I have personally interpreted Sinus  Rhythm with occasional PAC     Assessment & Plan:

## 2016-12-09 NOTE — Telephone Encounter (Signed)
Pt called in to get results of blood work

## 2016-12-11 DIAGNOSIS — R140 Abdominal distension (gaseous): Secondary | ICD-10-CM | POA: Insufficient documentation

## 2016-12-11 NOTE — Assessment & Plan Note (Signed)
stable overall by history and exam, recent data reviewed with pt, and pt to continue medical treatment as before,  to f/u any worsening symptoms or concerns Lab Results  Component Value Date   HGBA1C 5.6 12/09/2016

## 2016-12-11 NOTE — Assessment & Plan Note (Signed)
Also for symbicort, wean off prednisone if able, refer pulm, gave proair refills

## 2016-12-11 NOTE — Assessment & Plan Note (Signed)
stable overall by history and exam, recent data reviewed with pt, and pt to continue medical treatment as before,  to f/u any worsening symptoms or concerns BP Readings from Last 3 Encounters:  12/09/16 132/86  10/17/14 (!) 154/98  08/28/14 132/86

## 2016-12-11 NOTE — Assessment & Plan Note (Signed)

## 2016-12-11 NOTE — Assessment & Plan Note (Signed)
Mild, suspect fatty tissue, delcines abd u/s to r/o ascites

## 2016-12-11 NOTE — Assessment & Plan Note (Addendum)
For depomedrol IM 80,  Cont other tx, to f/u any worsening symptoms or concerns  In addition to the time spent performing CPE, I spent an additional 25 minutes face to face,in which greater than 50% of this time was spent in counseling and coordination of care for patient's acute illness as documented with copd exacerbation tx, abd distension etiology and evaluation, ETOH usage to reduce and quit, long term effects of chronic steroid use, and process to seek disability

## 2016-12-11 NOTE — Assessment & Plan Note (Signed)
For daily lowest dose prednisone asd,  to f/u any worsening symptoms or concerns

## 2016-12-11 NOTE — Assessment & Plan Note (Signed)
Lab Results  Component Value Date   LDLCALC 140 (H) 12/09/2016  for lower chol diet, delcines statin

## 2016-12-20 ENCOUNTER — Encounter: Payer: BLUE CROSS/BLUE SHIELD | Admitting: Internal Medicine

## 2017-01-26 ENCOUNTER — Institutional Professional Consult (permissible substitution): Payer: BLUE CROSS/BLUE SHIELD | Admitting: Internal Medicine

## 2017-02-03 ENCOUNTER — Other Ambulatory Visit: Payer: Self-pay | Admitting: Internal Medicine

## 2017-02-27 ENCOUNTER — Institutional Professional Consult (permissible substitution): Payer: BLUE CROSS/BLUE SHIELD | Admitting: Internal Medicine

## 2017-06-09 ENCOUNTER — Telehealth: Payer: Self-pay | Admitting: Emergency Medicine

## 2017-06-09 MED ORDER — ALBUTEROL SULFATE HFA 108 (90 BASE) MCG/ACT IN AERS: 2.0000 | INHALATION_SPRAY | Freq: Four times a day (QID) | RESPIRATORY_TRACT | 9 refills | Status: DC | PRN

## 2017-06-09 NOTE — Telephone Encounter (Signed)
Pt would like you to give him a call back about hi inhaler. He states they were declined refill and wants to speak to you about it. Please give him a call thanks.

## 2017-06-16 ENCOUNTER — Ambulatory Visit: Payer: BLUE CROSS/BLUE SHIELD | Admitting: Internal Medicine

## 2017-07-19 ENCOUNTER — Ambulatory Visit: Payer: BLUE CROSS/BLUE SHIELD | Admitting: Internal Medicine

## 2017-08-11 ENCOUNTER — Other Ambulatory Visit: Payer: Self-pay | Admitting: Internal Medicine

## 2017-09-15 ENCOUNTER — Ambulatory Visit: Payer: BLUE CROSS/BLUE SHIELD | Admitting: Internal Medicine

## 2017-11-02 ENCOUNTER — Ambulatory Visit: Payer: BLUE CROSS/BLUE SHIELD | Admitting: Internal Medicine

## 2017-12-06 ENCOUNTER — Other Ambulatory Visit: Payer: Self-pay | Admitting: Internal Medicine

## 2017-12-20 ENCOUNTER — Ambulatory Visit: Payer: BLUE CROSS/BLUE SHIELD | Admitting: Internal Medicine

## 2018-01-01 ENCOUNTER — Other Ambulatory Visit: Payer: Self-pay | Admitting: Internal Medicine

## 2018-01-11 ENCOUNTER — Ambulatory Visit: Payer: BLUE CROSS/BLUE SHIELD | Admitting: Internal Medicine

## 2018-02-06 ENCOUNTER — Ambulatory Visit: Payer: BLUE CROSS/BLUE SHIELD | Admitting: Internal Medicine

## 2018-02-16 ENCOUNTER — Other Ambulatory Visit: Payer: Self-pay | Admitting: Internal Medicine

## 2018-03-01 ENCOUNTER — Ambulatory Visit: Payer: BLUE CROSS/BLUE SHIELD | Admitting: Internal Medicine

## 2018-03-19 ENCOUNTER — Ambulatory Visit: Payer: BLUE CROSS/BLUE SHIELD | Admitting: Internal Medicine

## 2018-05-05 ENCOUNTER — Other Ambulatory Visit: Payer: Self-pay | Admitting: Internal Medicine

## 2018-07-05 ENCOUNTER — Other Ambulatory Visit: Payer: Self-pay | Admitting: Internal Medicine

## 2018-12-09 ENCOUNTER — Other Ambulatory Visit: Payer: Self-pay | Admitting: Internal Medicine

## 2018-12-10 ENCOUNTER — Other Ambulatory Visit: Payer: Self-pay | Admitting: Internal Medicine

## 2018-12-10 NOTE — Telephone Encounter (Signed)
LVM for patient to call back and set up virtual visit.

## 2018-12-10 NOTE — Telephone Encounter (Signed)
Pt left message on voicemail 12/10/2018 at 1002 requesting refill for amlodipine; states his pharmacy to call office because appointment is needed; will route to office for scheduling and final disposition.

## 2018-12-10 NOTE — Telephone Encounter (Signed)
Denied. Pt has not been seen since 12/09/2016. He can schedule an OV/ virtual to have a short supply called in.

## 2018-12-11 ENCOUNTER — Other Ambulatory Visit: Payer: Self-pay | Admitting: Internal Medicine

## 2018-12-11 MED ORDER — AMLODIPINE BESYLATE 5 MG PO TABS: 5.0000 mg | ORAL_TABLET | Freq: Every day | ORAL | 1 refills | Status: DC

## 2019-01-08 ENCOUNTER — Other Ambulatory Visit: Payer: Self-pay | Admitting: Internal Medicine

## 2019-01-08 NOTE — Telephone Encounter (Signed)
Denied. Pt has not been seen since 2018.

## 2019-01-08 NOTE — Telephone Encounter (Signed)
Copied from CRM [id number]. Topic: Quick Communication - Rx Refill/Question >> Jan 08, 2019  9:23 AM Zada Girt, Lumin L wrote: Medication: predniSONE (DELTASONE) 10 MG tablet (patient would be able to do a phone visit but that's it)  Has the patient contacted their pharmacy? {yes (Agent: If no, request that the patient contact the pharmacy for the refill.) (Agent: If yes, when and what did the pharmacy advise?)  Preferred Pharmacy (with phone number or street name): [id number] [place], [place] - [address] [place] [address] Phone: [phone] Fax: [phone]  Agent: Please be advised that RX refills may take up to 3 business days. We ask that you follow-up with your pharmacy.

## 2019-01-12 ENCOUNTER — Other Ambulatory Visit: Payer: Self-pay | Admitting: Internal Medicine

## 2019-01-14 NOTE — Addendum Note (Signed)
Addended by: Deatra James on: 01/14/2019 09:47 AM   Modules accepted: Orders

## 2019-01-14 NOTE — Telephone Encounter (Signed)
Patient is unable to come in for a OV due to he no longer has insurance. He would like to know if he can get a refill until he has insurance again. Please advise.

## 2019-01-14 NOTE — Telephone Encounter (Signed)
Ok to add to the 4PM slot for may 12; I will call him

## 2019-06-10 ENCOUNTER — Other Ambulatory Visit: Payer: Self-pay | Admitting: Internal Medicine

## 2019-06-18 ENCOUNTER — Telehealth: Payer: Self-pay | Admitting: Internal Medicine

## 2019-06-18 NOTE — Telephone Encounter (Signed)
Ok this time 

## 2019-06-18 NOTE — Telephone Encounter (Signed)
Copied from [place] [id number]. Topic: Appointment Scheduling - Scheduling Inquiry for Clinic >> Jun 18, 2019  1:40 PM Rayann Heman wrote: Reason for CRM: pt called and stated that he would like to schedule a physical so that he can have medications called in but would like the physical to be virtual.

## 2019-06-18 NOTE — Telephone Encounter (Signed)
Patient called requesting a virtual physical appointment for medication refills with Dr Jenny Reichmann. I called patient and informed him that we are not able to do virtual physicals but we could set up a medication refill appointment for him.  Patient states that he does not have a cell phone or any way to do a virtual visit and prefers to not come into the office.  I have scheduled this medication refill appointment as a phone visit on Thursday due to him being out of his medications and no recent appointments have been completed.  Is this okay with you?

## 2019-06-20 ENCOUNTER — Encounter: Payer: Self-pay | Admitting: Internal Medicine

## 2019-06-20 ENCOUNTER — Ambulatory Visit (INDEPENDENT_AMBULATORY_CARE_PROVIDER_SITE_OTHER): Payer: BC Managed Care – PPO | Admitting: Internal Medicine

## 2019-06-20 DIAGNOSIS — R7302 Impaired glucose tolerance (oral): Secondary | ICD-10-CM

## 2019-06-20 DIAGNOSIS — I10 Essential (primary) hypertension: Secondary | ICD-10-CM | POA: Diagnosis not present

## 2019-06-20 DIAGNOSIS — J449 Chronic obstructive pulmonary disease, unspecified: Secondary | ICD-10-CM | POA: Diagnosis not present

## 2019-06-20 MED ORDER — BUDESONIDE-FORMOTEROL FUMARATE 160-4.5 MCG/ACT IN AERO: 2.0000 | INHALATION_SPRAY | Freq: Two times a day (BID) | RESPIRATORY_TRACT | 11 refills | Status: DC

## 2019-06-20 MED ORDER — ALBUTEROL SULFATE HFA 108 (90 BASE) MCG/ACT IN AERS: 2.0000 | INHALATION_SPRAY | Freq: Four times a day (QID) | RESPIRATORY_TRACT | 11 refills | Status: DC | PRN

## 2019-06-20 MED ORDER — PREDNISONE 10 MG PO TABS: ORAL_TABLET | ORAL | 3 refills | Status: DC

## 2019-06-20 MED ORDER — AMLODIPINE BESYLATE 5 MG PO TABS: 5.0000 mg | ORAL_TABLET | Freq: Every day | ORAL | 3 refills | Status: DC

## 2019-06-20 NOTE — Patient Instructions (Signed)
Please continue all other medications as before, and refills have been done if requested.  Please have the pharmacy call with any other refills you may need.  Please continue your efforts at being more active, low cholesterol diet, and weight control.  Please keep your appointments with your specialists as you may have planned  Please return in 6 months, or sooner if needed 

## 2019-06-20 NOTE — Assessment & Plan Note (Signed)
stable overall by history and exam, recent data reviewed with pt, and pt to continue medical treatment as before,  to f/u any worsening symptoms or concerns  

## 2019-06-20 NOTE — Progress Notes (Signed)
Patient ID: Colin Wilson, male   DOB: 1956/07/23, 63 y.o.   MRN: [id number]  Phone visit  Cumulative time during 7-day interval 12 min, there was not an associated office visit for this concern within a 7 day period.  Verbal consent for services obtained from patient prior to services given.  Names of all persons present for services: Cathlean Cower, MD, patient  Chief complaint: HTN, copd  History, background, results pertinent:  Here to f/u; overall doing ok,  Pt denies chest pain, increasing sob or doe, wheezing, orthopnea, PND, increased LE swelling, palpitations, dizziness or syncope.  Pt denies new neurological symptoms such as new headache, or facial or extremity weakness or numbness.  Pt denies polydipsia, polyuria, or low sugar episode.  Pt states overall good compliance with meds, mostly trying to follow appropriate diet, with wt overall stable.  Wt at home now 215 today.  BP at home < 140/90 Wt Readings from Last 3 Encounters:  12/09/16 218 lb (98.9 kg)  10/17/14 226 lb (102.5 kg)  08/28/14 241 lb (109.3 kg)   Past Medical History:  Diagnosis Date  . Asthma   . Asthma, chronic 08/28/2014  . Heart attack ([place])   . Hypertension   . Impaired glucose tolerance 12/12/2012   No results found for this or any previous visit (from the past 48 hour(s)). Current Outpatient Medications on File Prior to Visit  Medication Sig Dispense Refill  . aspirin 81 MG EC tablet Take 1 tablet (81 mg total) by mouth daily. Swallow whole. 30 tablet 12   No current facility-administered medications on file prior to visit.    Lab Results  Component Value Date   WBC 9.1 12/09/2016   HGB 16.8 12/09/2016   HCT 48.2 12/09/2016   PLT 266.0 12/09/2016   GLUCOSE 102 (H) 12/09/2016   CHOL 225 (H) 12/09/2016   TRIG 65.0 12/09/2016   HDL 71.90 12/09/2016   LDLDIRECT 150.6 12/12/2012   LDLCALC 140 (H) 12/09/2016   ALT 33 12/09/2016   AST 26 12/09/2016   NA 139 12/09/2016   K 4.5 12/09/2016   CL 101  12/09/2016   CREATININE 0.86 12/09/2016   BUN 8 12/09/2016   CO2 33 (H) 12/09/2016   TSH 2.43 12/09/2016   PSA 1.36 12/09/2016   HGBA1C 5.6 12/09/2016    A/P/next steps:   HTN - stable, cont current tx  COPD - stable overall by history and exam, recent data reviewed with pt, and pt to continue medical treatment as before,  to f/u any worsening symptoms or concerns  Hyperglycemia - stable overall by history and exam, recent data reviewed with pt, and pt to continue medical treatment as before,  to f/u any worsening symptoms or concerns  Cathlean Cower MD

## 2020-06-07 ENCOUNTER — Other Ambulatory Visit: Payer: Self-pay | Admitting: Internal Medicine

## 2020-06-10 ENCOUNTER — Other Ambulatory Visit: Payer: Self-pay | Admitting: Internal Medicine

## 2020-06-10 NOTE — Telephone Encounter (Signed)
Ok for one month only for each med  Please to contact pt - due for ROV

## 2020-07-14 ENCOUNTER — Other Ambulatory Visit: Payer: Self-pay | Admitting: Internal Medicine

## 2020-08-09 ENCOUNTER — Other Ambulatory Visit: Payer: Self-pay | Admitting: Internal Medicine

## 2020-08-09 NOTE — Telephone Encounter (Signed)
Please refill as per office routine med refill policy (all routine meds refilled for 3 mo or monthly per pt preference up to one year from last visit, then month to month grace period for 3 mo, then further med refills will have to be denied)  

## 2020-08-19 ENCOUNTER — Telehealth: Payer: Self-pay | Admitting: Internal Medicine

## 2020-08-19 ENCOUNTER — Other Ambulatory Visit: Payer: Self-pay

## 2020-08-19 MED ORDER — AMLODIPINE BESYLATE 5 MG PO TABS
ORAL_TABLET | ORAL | 0 refills | Status: DC
Start: 2020-08-19 — End: 2020-09-18

## 2020-08-19 NOTE — Telephone Encounter (Signed)
1.Medication Requested: amLODipine (NORVASC) 5 MG tablet    2. Pharmacy (Name, Street, [place]): [id number] - , [place] - [address]   3. On Med List: yes   4. Last Visit with PCP: 10.15.2020  5. Next visit date with PCP: 12.21.21   Agent: Please be advised that RX refills may take up to 3 business days. We ask that you follow-up with your pharmacy.

## 2020-08-25 ENCOUNTER — Ambulatory Visit: Payer: BC Managed Care – PPO | Admitting: Internal Medicine

## 2020-09-07 ENCOUNTER — Other Ambulatory Visit: Payer: Self-pay | Admitting: Internal Medicine

## 2020-09-12 ENCOUNTER — Other Ambulatory Visit: Payer: Self-pay | Admitting: Internal Medicine

## 2020-09-13 NOTE — Telephone Encounter (Signed)
Please refill as per office routine med refill policy (all routine meds refilled for 3 mo or monthly per pt preference up to one year from last visit, then month to month grace period for 3 mo, then further med refills will have to be denied)  

## 2020-09-18 ENCOUNTER — Encounter: Payer: Self-pay | Admitting: Internal Medicine

## 2020-09-18 ENCOUNTER — Telehealth (INDEPENDENT_AMBULATORY_CARE_PROVIDER_SITE_OTHER): Payer: BC Managed Care – PPO | Admitting: Internal Medicine

## 2020-09-18 DIAGNOSIS — J449 Chronic obstructive pulmonary disease, unspecified: Secondary | ICD-10-CM | POA: Diagnosis not present

## 2020-09-18 DIAGNOSIS — I10 Essential (primary) hypertension: Secondary | ICD-10-CM

## 2020-09-18 DIAGNOSIS — Z7952 Long term (current) use of systemic steroids: Secondary | ICD-10-CM

## 2020-09-18 DIAGNOSIS — E559 Vitamin D deficiency, unspecified: Secondary | ICD-10-CM

## 2020-09-18 MED ORDER — ALBUTEROL SULFATE HFA 108 (90 BASE) MCG/ACT IN AERS: INHALATION_SPRAY | RESPIRATORY_TRACT | 11 refills | Status: DC

## 2020-09-18 MED ORDER — AMLODIPINE BESYLATE 5 MG PO TABS
ORAL_TABLET | ORAL | 3 refills | Status: DC
Start: 2020-09-18 — End: 2021-08-26

## 2020-09-18 MED ORDER — PREDNISONE 10 MG PO TABS
ORAL_TABLET | ORAL | 5 refills | Status: DC
Start: 2020-09-18 — End: 2021-03-19

## 2020-09-18 NOTE — Progress Notes (Signed)
Patient ID: Colin Wilson, male   DOB: [birth year], 65 y.o.   MRN: [id number]  Cumulative time during 7-day interval 22 min, there was not an associated office visit for this concern within a 7 day period.  Verbal consent for services obtained from patient prior to services given.  Names of all persons present for services: Oliver Barre, MD, patient   Chief complaint: yearly f/u, has been house bound by choice during covid for nearly 2 yrs, does not want to leave during covid due to high risk copd   History, background, results pertinent:  Pt denies chest pain, increased sob or doe, wheezing, orthopnea, PND, increased LE swelling, palpitations, dizziness or syncope.   Pt denies polydipsia, polyuria  Needs med refills. Overall feels stable but can only walk about 50 yds at a time without rest.  Has to rest several times just to vacuum a 800 sq ft room.  Does not want pulm referral for now as to wary of leaving the house.  Pt denies fever, wt loss, night sweats, loss of appetite, or other constitutional symptoms    Past Medical History:  Diagnosis Date  . Asthma   . Asthma, chronic 08/28/2014  . Heart attack ([facility])   . Hypertension   . Impaired glucose tolerance 12/12/2012   No results found for this or any previous visit (from the past 48 hour(s)). Current Outpatient Medications on File Prior to Visit  Medication Sig Dispense Refill  . aspirin 81 MG EC tablet Take 1 tablet (81 mg total) by mouth daily. Swallow whole. 30 tablet 12   No current facility-administered medications on file prior to visit.    A/P/next steps:   COPD - end stable, stable, declines symbicort, cont albuterol hfa, prednisone daily, declines pulm referal,  to f/u any worsening symptoms or concerns  HTN - stable, cont amlodipine  Vit D deficiency - cont oral replacement  Oliver Barre, MD 09/18/2020 10:00 PM [facility]

## 2020-09-18 NOTE — Assessment & Plan Note (Signed)
See notes

## 2020-09-18 NOTE — Patient Instructions (Signed)
Please continue all other medications as before, and refills have been done if requested.  Please have the pharmacy call with any other refills you may need.  Please continue your efforts at being more active, low cholesterol diet, and weight control.  Please keep your appointments with your specialists as you may have planned     

## 2020-12-03 ENCOUNTER — Other Ambulatory Visit: Payer: Self-pay

## 2021-03-19 ENCOUNTER — Other Ambulatory Visit: Payer: Self-pay | Admitting: Internal Medicine

## 2021-07-03 ENCOUNTER — Other Ambulatory Visit: Payer: Self-pay | Admitting: Internal Medicine

## 2021-07-03 NOTE — Telephone Encounter (Signed)
Please refill as per office routine med refill policy (all routine meds to be refilled for 3 mo or monthly (per pt preference) up to one year from last visit, then month to month grace period for 3 mo, then further med refills will have to be denied) ? ?

## 2021-08-20 ENCOUNTER — Other Ambulatory Visit: Payer: Self-pay | Admitting: Internal Medicine

## 2021-08-21 NOTE — Telephone Encounter (Signed)
Please refill as per office routine med refill policy (all routine meds to be refilled for 3 mo or monthly (per pt preference) up to one year from last visit, then month to month grace period for 3 mo, then further med refills will have to be denied) ? ?

## 2021-09-11 ENCOUNTER — Other Ambulatory Visit: Payer: Self-pay | Admitting: Internal Medicine

## 2021-10-23 ENCOUNTER — Other Ambulatory Visit: Payer: Self-pay | Admitting: Internal Medicine

## 2022-05-16 ENCOUNTER — Other Ambulatory Visit: Payer: Self-pay | Admitting: Internal Medicine

## 2022-07-14 ENCOUNTER — Other Ambulatory Visit: Payer: Self-pay | Admitting: Internal Medicine

## 2022-07-14 NOTE — Telephone Encounter (Signed)
Please refill as per office routine med refill policy (all routine meds to be refilled for 3 mo or monthly (per pt preference) up to one year from last visit, then month to month grace period for 3 mo, then further med refills will have to be denied) ? ?

## 2022-08-02 ENCOUNTER — Other Ambulatory Visit: Payer: Self-pay | Admitting: Internal Medicine

## 2022-08-03 NOTE — Telephone Encounter (Signed)
Please refill as per office routine med refill policy (all routine meds to be refilled for 3 mo or monthly (per pt preference) up to one year from last visit, then month to month grace period for 3 mo, then further med refills will have to be denied) ? ?

## 2022-08-13 ENCOUNTER — Other Ambulatory Visit: Payer: Self-pay | Admitting: Internal Medicine

## 2022-08-14 NOTE — Telephone Encounter (Signed)
Please refill as per office routine med refill policy (all routine meds to be refilled for 3 mo or monthly (per pt preference) up to one year from last visit, then month to month grace period for 3 mo, then further med refills will have to be denied) ? ?

## 2022-09-08 ENCOUNTER — Other Ambulatory Visit: Payer: Self-pay | Admitting: Internal Medicine

## 2022-09-08 NOTE — Telephone Encounter (Signed)
Please refill as per office routine med refill policy (all routine meds to be refilled for 3 mo or monthly (per pt preference) up to one year from last visit, then month to month grace period for 3 mo, then further med refills will have to be denied) ? ?

## 2022-09-12 ENCOUNTER — Telehealth: Payer: Self-pay

## 2022-09-12 ENCOUNTER — Other Ambulatory Visit: Payer: Self-pay | Admitting: Internal Medicine

## 2022-09-12 MED ORDER — AMLODIPINE BESYLATE 5 MG PO TABS: 5.0000 mg | ORAL_TABLET | Freq: Every day | ORAL | 0 refills | Status: AC

## 2022-09-12 MED ORDER — ALBUTEROL SULFATE HFA 108 (90 BASE) MCG/ACT IN AERS: INHALATION_SPRAY | RESPIRATORY_TRACT | 3 refills | Status: DC

## 2022-09-12 NOTE — Telephone Encounter (Signed)
Ok I have refilled his amlodipine and albuterol inhaler  Please ask pt to make appt after Mar 1, as the flu and covid season should be over by then

## 2022-09-12 NOTE — Telephone Encounter (Signed)
I was able to speak with the pt and inform him of Dr. Gwynn Burly advice. Pt states he will call the clinic back once he has set-up transportation to sched his apptmnt.

## 2022-09-12 NOTE — Telephone Encounter (Signed)
Pt has asked that he get a refill for his Amlodipine and his Albuterol inhaler. Pt was informed he is needing a visit with PCP and he has stated he does want to come in and risk his health with the rise in COVID, RSV, FLU and other viruses that is going around. Pt asked can he do a telephone visit as he does not have MyChart or knows how to use.   Pt can be contacted at [phone] for further instructions.

## 2022-09-15 ENCOUNTER — Other Ambulatory Visit: Payer: Self-pay | Admitting: Internal Medicine

## 2022-09-15 ENCOUNTER — Telehealth: Payer: Self-pay | Admitting: Internal Medicine

## 2022-09-15 MED ORDER — ALBUTEROL SULFATE HFA 108 (90 BASE) MCG/ACT IN AERS: 2.0000 | INHALATION_SPRAY | Freq: Four times a day (QID) | RESPIRATORY_TRACT | 11 refills | Status: AC | PRN

## 2022-09-15 NOTE — Telephone Encounter (Signed)
See below

## 2022-09-15 NOTE — Telephone Encounter (Signed)
Patient called stating that the wrong inhaler was called into his pharmacy on file. Patient stated that the inhaler that was called in is not working for him. Patient is requesting the inhaler Ventolin albuterol (PROVENTIL HFA;VENTOLIN HFA) 108 (90 BASE) MCG/ACT inhaler [[id number]] to called into his pharmacy instead with the same amount of refills. [id number] - [place], [place].  Best callback number is [phone].

## 2022-09-15 NOTE — Telephone Encounter (Signed)
Patient is requesting a change with inhaler recently prescribed as it is not working for him

## 2022-09-15 NOTE — Telephone Encounter (Signed)
This is quite unusual since they are essentially the same medications  The reason the rx was changed was that the Ventolin was not covered by his insurance.  I will refill, but watch for sticker shock

## 2022-09-15 NOTE — Telephone Encounter (Signed)
As I thought, the med is not covered.  Please ask pt to ask for generic but it still may not be covered by his insurance

## 2022-09-15 NOTE — Telephone Encounter (Signed)
Made pt aware of requesting medication being send in to pharmacy and should call to pick up

## 2022-11-08 ENCOUNTER — Other Ambulatory Visit: Payer: Self-pay | Admitting: Internal Medicine

## 2022-12-08 ENCOUNTER — Other Ambulatory Visit: Payer: Self-pay | Admitting: Internal Medicine

## 2023-06-26 ENCOUNTER — Telehealth: Payer: Self-pay | Admitting: Internal Medicine

## 2023-07-25 ENCOUNTER — Other Ambulatory Visit: Payer: Self-pay | Admitting: Internal Medicine
